# Patient Record
Sex: Female | Born: 1955 | Race: White | Hispanic: No | Marital: Single | Smoking: Never smoker
Health system: Southern US, Community
[De-identification: ages and names within clinical notes are randomized; demographics above are authoritative.]

## PROBLEM LIST (undated history)

## (undated) DIAGNOSIS — F32A Depression, unspecified: Secondary | ICD-10-CM

## (undated) DIAGNOSIS — M797 Fibromyalgia: Secondary | ICD-10-CM

## (undated) DIAGNOSIS — M1990 Unspecified osteoarthritis, unspecified site: Secondary | ICD-10-CM

## (undated) DIAGNOSIS — N83209 Unspecified ovarian cyst, unspecified side: Secondary | ICD-10-CM

## (undated) DIAGNOSIS — J189 Pneumonia, unspecified organism: Secondary | ICD-10-CM

## (undated) DIAGNOSIS — R030 Elevated blood-pressure reading, without diagnosis of hypertension: Secondary | ICD-10-CM

## (undated) DIAGNOSIS — Z8739 Personal history of other diseases of the musculoskeletal system and connective tissue: Secondary | ICD-10-CM

## (undated) DIAGNOSIS — J40 Bronchitis, not specified as acute or chronic: Secondary | ICD-10-CM

## (undated) DIAGNOSIS — C801 Malignant (primary) neoplasm, unspecified: Secondary | ICD-10-CM

## (undated) DIAGNOSIS — F329 Major depressive disorder, single episode, unspecified: Secondary | ICD-10-CM

## (undated) DIAGNOSIS — F419 Anxiety disorder, unspecified: Secondary | ICD-10-CM

## (undated) DIAGNOSIS — Z800 Family history of malignant neoplasm of digestive organs: Secondary | ICD-10-CM

## (undated) DIAGNOSIS — K219 Gastro-esophageal reflux disease without esophagitis: Secondary | ICD-10-CM

## (undated) DIAGNOSIS — T7840XA Allergy, unspecified, initial encounter: Secondary | ICD-10-CM

## (undated) DIAGNOSIS — R009 Unspecified abnormalities of heart beat: Secondary | ICD-10-CM

## (undated) DIAGNOSIS — C44609 Unspecified malignant neoplasm of skin of left upper limb, including shoulder: Secondary | ICD-10-CM

## (undated) DIAGNOSIS — E781 Pure hyperglyceridemia: Secondary | ICD-10-CM

## (undated) HISTORY — DX: Unspecified osteoarthritis, unspecified site: M19.90

## (undated) HISTORY — DX: Unspecified ovarian cyst, unspecified side: N83.209

## (undated) HISTORY — DX: Depression, unspecified: F32.A

## (undated) HISTORY — DX: Unspecified malignant neoplasm of skin of left upper limb, including shoulder: C44.609

## (undated) HISTORY — DX: Bronchitis, not specified as acute or chronic: J40

## (undated) HISTORY — DX: Family history of malignant neoplasm of digestive organs: Z80.0

## (undated) HISTORY — PX: APPENDECTOMY: SHX54

## (undated) HISTORY — PX: ABDOMINAL HYSTERECTOMY: SHX81

## (undated) HISTORY — PX: COLONOSCOPY: SHX174

## (undated) HISTORY — DX: Gastro-esophageal reflux disease without esophagitis: K21.9

## (undated) HISTORY — DX: Major depressive disorder, single episode, unspecified: F32.9

## (undated) HISTORY — DX: Malignant (primary) neoplasm, unspecified: C80.1

## (undated) HISTORY — DX: Elevated blood-pressure reading, without diagnosis of hypertension: R03.0

## (undated) HISTORY — PX: BACK SURGERY: SHX140

## (undated) HISTORY — DX: Pneumonia, unspecified organism: J18.9

## (undated) HISTORY — DX: Fibromyalgia: M79.7

## (undated) HISTORY — DX: Personal history of other diseases of the musculoskeletal system and connective tissue: Z87.39

## (undated) HISTORY — DX: Allergy, unspecified, initial encounter: T78.40XA

## (undated) HISTORY — DX: Unspecified abnormalities of heart beat: R00.9

## (undated) HISTORY — PX: OVARIAN CYST REMOVAL: SHX89

---

## 2009-02-17 ENCOUNTER — Other Ambulatory Visit: Admission: RE | Admit: 2009-02-17 | Discharge: 2009-02-17 | Payer: Self-pay | Admitting: Obstetrics and Gynecology

## 2009-02-24 ENCOUNTER — Encounter: Admission: RE | Admit: 2009-02-24 | Discharge: 2009-02-24 | Payer: Self-pay | Admitting: Obstetrics and Gynecology

## 2010-03-17 ENCOUNTER — Other Ambulatory Visit
Admission: RE | Admit: 2010-03-17 | Discharge: 2010-03-17 | Payer: Self-pay | Source: Home / Self Care | Admitting: Obstetrics and Gynecology

## 2014-06-13 ENCOUNTER — Encounter: Payer: Self-pay | Admitting: Specialist

## 2014-06-13 HISTORY — PX: COLONOSCOPY: SHX174

## 2017-04-21 ENCOUNTER — Emergency Department (HOSPITAL_BASED_OUTPATIENT_CLINIC_OR_DEPARTMENT_OTHER)
Admission: EM | Admit: 2017-04-21 | Discharge: 2017-04-21 | Disposition: A | Discharge status: Home / Self Care | Payer: Worker's Compensation | Attending: Emergency Medicine | Admitting: Emergency Medicine

## 2017-04-21 ENCOUNTER — Encounter (HOSPITAL_BASED_OUTPATIENT_CLINIC_OR_DEPARTMENT_OTHER): Payer: Self-pay | Admitting: *Deleted

## 2017-04-21 ENCOUNTER — Other Ambulatory Visit: Payer: Self-pay

## 2017-04-21 DIAGNOSIS — Y990 Civilian activity done for income or pay: Secondary | ICD-10-CM | POA: Insufficient documentation

## 2017-04-21 DIAGNOSIS — Y9389 Activity, other specified: Secondary | ICD-10-CM | POA: Diagnosis not present

## 2017-04-21 DIAGNOSIS — S39012A Strain of muscle, fascia and tendon of lower back, initial encounter: Secondary | ICD-10-CM | POA: Insufficient documentation

## 2017-04-21 DIAGNOSIS — X500XXA Overexertion from strenuous movement or load, initial encounter: Secondary | ICD-10-CM | POA: Insufficient documentation

## 2017-04-21 DIAGNOSIS — Y92219 Unspecified school as the place of occurrence of the external cause: Secondary | ICD-10-CM | POA: Insufficient documentation

## 2017-04-21 DIAGNOSIS — S3992XA Unspecified injury of lower back, initial encounter: Secondary | ICD-10-CM | POA: Diagnosis present

## 2017-04-21 DIAGNOSIS — Z79899 Other long term (current) drug therapy: Secondary | ICD-10-CM | POA: Diagnosis not present

## 2017-04-21 HISTORY — DX: Depression, unspecified: F32.A

## 2017-04-21 HISTORY — DX: Anxiety disorder, unspecified: F41.9

## 2017-04-21 HISTORY — DX: Pure hyperglyceridemia: E78.1

## 2017-04-21 HISTORY — DX: Major depressive disorder, single episode, unspecified: F32.9

## 2017-04-21 MED ORDER — METHOCARBAMOL 500 MG PO TABS: 500.0000 mg | ORAL_TABLET | Freq: Three times a day (TID) | ORAL | 0 refills | Status: DC | PRN

## 2017-04-21 NOTE — ED Notes (Signed)
ED Provider at bedside. 

## 2017-04-21 NOTE — ED Triage Notes (Signed)
Back and neck injury. Pain in both shoulders. 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 She is ambulatory. No UDS required per pt.

## 2017-04-21 NOTE — ED Provider Notes (Signed)
[facility] EMERGENCY DEPARTMENT Provider Note   CSN: [id number] Arrival date & time: 04/21/17  1417     History   Chief Complaint Chief Complaint  Patient presents with  . Back Injury    HPI Lisa SHOAF is a 62 y.o. female.  HPI Patient with lower back pain.  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  Developed low back pain.  Has been icing.  Has taken Motrin with some relief of pain.  However has continued pain.  Worse with movements.  Has had some previous neck problems but no previous surgery. Past Medical History:  Diagnosis Date  . Anxiety   . Depression   . Hypertriglyceridemia     There are no active problems to display for this patient.   Past Surgical History:  Procedure Laterality Date  . ABDOMINAL HYSTERECTOMY    . APPENDECTOMY    . OVARIAN CYST REMOVAL      OB History    No data available       Home Medications    Prior to Admission medications   Medication Sig Start Date End Date Taking? Authorizing Provider  Citalopram Hydrobromide (CELEXA PO) Take by mouth.   Yes [provider]  Eluxadoline (VIBERZI PO) Take by mouth.   Yes [provider]  FENOFIBRATE PO Take by mouth.   Yes [provider]  GABAPENTIN PO Take by mouth.   Yes [provider]  methocarbamol (ROBAXIN) 500 MG tablet Take 1 tablet (500 mg total) by mouth every 8 (eight) hours as needed for muscle spasms. 04/21/17   Davonna Belling, MD    Family History No family history on file.  Social History Social History   Tobacco Use  . Smoking status: Never Smoker  . Smokeless tobacco: Never Used  Substance Use Topics  . Alcohol use: Yes    Frequency: Never  . Drug use: No     Allergies   Patient has no known allergies.   Review of Systems Review of Systems  Constitutional: Negative for appetite change and fatigue.  HENT: Negative for congestion.    Cardiovascular: Negative for chest pain.  Gastrointestinal: Negative for abdominal pain.  Genitourinary: Negative for difficulty urinating.  Musculoskeletal: Positive for back pain. Negative for neck pain.  Skin: Negative for rash.  Neurological: Negative for weakness and numbness.  Psychiatric/Behavioral: Negative for confusion.     Physical Exam Updated Vital Signs BP 114/71 (BP Location: Left Arm)   Pulse 76   Temp 98.7 F (37.1 C) (Oral)   Resp 16   Ht 5\' 4"  (1.626 m)   Wt 68.9 kg (152 lb)   SpO2 94%   BMI 26.09 kg/m   Physical Exam  Constitutional: She appears well-developed.  HENT:  Head: Atraumatic.  Eyes: Pupils are equal, round, and reactive to light.  Cardiovascular: Normal rate.  Abdominal: Soft. There is no tenderness.  Musculoskeletal:  Mild lumbar paraspinal tenderness.  Good range of motion.  Patient able to bend over and touch her toes without back pain radiating down her legs.  Neurological: She is alert.  Skin: Skin is warm. Capillary refill takes less than 2 seconds.     ED Treatments / Results  Labs (all labs ordered are listed, but only abnormal results are displayed) Labs Reviewed - No data to display  EKG  EKG Interpretation None       Radiology No results  found.  Procedures Procedures (including critical care time)  Medications Ordered in ED Medications - No data to display   Initial Impression / Assessment and Plan / ED Course  I have reviewed the triage vital signs and the nursing notes.  Pertinent labs & imaging results that were available during my care of the patient were reviewed by me and considered in my medical decision making (see chart for details).     Patient with likely muscular skeletal back pain.  Rather benign exam without red flags.  Will discharge home with muscle relaxers.  Follow-up as needed. Final Clinical Impressions(s) / ED Diagnoses   Final diagnoses:  Strain of lumbar region, initial encounter      ED Discharge Orders        Ordered    methocarbamol (ROBAXIN) 500 MG tablet  Every 8 hours PRN     04/21/17 1809       Davonna Belling, MD 04/21/17 1839

## 2018-05-17 DIAGNOSIS — K580 Irritable bowel syndrome with diarrhea: Secondary | ICD-10-CM | POA: Diagnosis not present

## 2018-05-17 DIAGNOSIS — M797 Fibromyalgia: Secondary | ICD-10-CM | POA: Diagnosis not present

## 2018-05-17 DIAGNOSIS — Z6827 Body mass index (BMI) 27.0-27.9, adult: Secondary | ICD-10-CM | POA: Diagnosis not present

## 2018-05-17 DIAGNOSIS — K5732 Diverticulitis of large intestine without perforation or abscess without bleeding: Secondary | ICD-10-CM | POA: Diagnosis not present

## 2018-07-17 DIAGNOSIS — M797 Fibromyalgia: Secondary | ICD-10-CM | POA: Diagnosis not present

## 2018-07-17 DIAGNOSIS — R220 Localized swelling, mass and lump, head: Secondary | ICD-10-CM | POA: Diagnosis not present

## 2018-07-17 DIAGNOSIS — E663 Overweight: Secondary | ICD-10-CM | POA: Diagnosis not present

## 2018-07-17 DIAGNOSIS — K580 Irritable bowel syndrome with diarrhea: Secondary | ICD-10-CM | POA: Diagnosis not present

## 2018-07-17 DIAGNOSIS — Z1331 Encounter for screening for depression: Secondary | ICD-10-CM | POA: Diagnosis not present

## 2018-07-17 DIAGNOSIS — Z9181 History of falling: Secondary | ICD-10-CM | POA: Diagnosis not present

## 2018-07-17 DIAGNOSIS — K5732 Diverticulitis of large intestine without perforation or abscess without bleeding: Secondary | ICD-10-CM | POA: Diagnosis not present

## 2019-03-28 DIAGNOSIS — K219 Gastro-esophageal reflux disease without esophagitis: Secondary | ICD-10-CM | POA: Diagnosis not present

## 2019-03-28 DIAGNOSIS — M797 Fibromyalgia: Secondary | ICD-10-CM | POA: Diagnosis not present

## 2019-05-07 DIAGNOSIS — B9689 Other specified bacterial agents as the cause of diseases classified elsewhere: Secondary | ICD-10-CM | POA: Diagnosis not present

## 2019-05-07 DIAGNOSIS — R6889 Other general symptoms and signs: Secondary | ICD-10-CM | POA: Diagnosis not present

## 2019-05-07 DIAGNOSIS — J029 Acute pharyngitis, unspecified: Secondary | ICD-10-CM | POA: Diagnosis not present

## 2019-05-07 DIAGNOSIS — J208 Acute bronchitis due to other specified organisms: Secondary | ICD-10-CM | POA: Diagnosis not present

## 2019-05-10 DIAGNOSIS — R6889 Other general symptoms and signs: Secondary | ICD-10-CM | POA: Diagnosis not present

## 2019-05-15 DIAGNOSIS — B9689 Other specified bacterial agents as the cause of diseases classified elsewhere: Secondary | ICD-10-CM | POA: Diagnosis not present

## 2019-05-15 DIAGNOSIS — J208 Acute bronchitis due to other specified organisms: Secondary | ICD-10-CM | POA: Diagnosis not present

## 2019-05-15 DIAGNOSIS — H00015 Hordeolum externum left lower eyelid: Secondary | ICD-10-CM | POA: Diagnosis not present

## 2019-05-15 DIAGNOSIS — H04122 Dry eye syndrome of left lacrimal gland: Secondary | ICD-10-CM | POA: Diagnosis not present

## 2019-06-07 DIAGNOSIS — R05 Cough: Secondary | ICD-10-CM | POA: Diagnosis not present

## 2019-06-10 DIAGNOSIS — R05 Cough: Secondary | ICD-10-CM | POA: Diagnosis not present

## 2019-06-10 DIAGNOSIS — R0602 Shortness of breath: Secondary | ICD-10-CM | POA: Diagnosis not present

## 2019-06-27 ENCOUNTER — Ambulatory Visit: Payer: Medicare PPO | Admitting: Internal Medicine

## 2019-06-27 ENCOUNTER — Other Ambulatory Visit: Payer: Self-pay

## 2019-06-27 ENCOUNTER — Encounter: Payer: Self-pay | Admitting: Internal Medicine

## 2019-06-27 VITALS — BP 106/66 | HR 80 | Temp 97.9°F | Ht 64.0 in | Wt 158.0 lb

## 2019-06-27 DIAGNOSIS — J301 Allergic rhinitis due to pollen: Secondary | ICD-10-CM | POA: Diagnosis not present

## 2019-06-27 DIAGNOSIS — R0602 Shortness of breath: Secondary | ICD-10-CM | POA: Diagnosis not present

## 2019-06-27 DIAGNOSIS — R053 Chronic cough: Secondary | ICD-10-CM

## 2019-06-27 DIAGNOSIS — R05 Cough: Secondary | ICD-10-CM

## 2019-06-27 MED ORDER — FLUTICASONE PROPIONATE 50 MCG/ACT NA SUSP: 1.0000 | Freq: Every day | NASAL | 5 refills | Status: DC

## 2019-06-27 MED ORDER — MONTELUKAST SODIUM 10 MG PO TABS: 10.0000 mg | ORAL_TABLET | Freq: Every day | ORAL | 11 refills | Status: DC

## 2019-06-27 NOTE — Progress Notes (Signed)
Lisa Graham    [id number]    Feb 19, 1956  Primary Care Physician:System, Pcp Not In  Referring Physician: Nicoletta Dress, MD [address] [place],  [place] [address] Reason for Consultation: "Cough and congestion SOB" Date of Consultation: 66/15/2021  Chief complaint:   Chief Complaint  Patient presents with  . Consult    SOB, cough and congestion.  Prouductive cough clear to yellow, thick..  Coughs some at night, mostly during the day.  Fatigues easily.     HPI: Lisa Graham is a 64 y.o. woman here with cough and shortness of breath with congestion. Seen by primary care and has had two courses of antibiotics (one with steroids) for bronchitis in the last month.  Here with a third bout of "bronchitis" today which seems to be resolving spontaneously. Cough is day and night with clear mucus, sometimes sticky and green-yellow. No blood.   She has chest congestion. Has been out gardening in the yard. No sneezing.   Eyes are itchy. And she has chronic dry eyes.  No fevers, chills night sweats. Appetite is good. + Frequent throat clearing.   Has had pneumonia/bronchitis almost every year. She does feel short of breath. Never been prescribed any inhalers.  She ran cross country and ran two marathons and never had difficulty with her breathing.    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  Exposures: lives in [place], grew up in [place]. But moved back to [place] from [place] in 2007. Lives at home with poodle.  Smoking history: never smoker, no significant passive smoke exposure.   Social History   Occupational History  . Not on file  Tobacco Use  . Smoking status: Never Smoker  . Smokeless tobacco: Never Used  Substance and Sexual Activity  . Alcohol use: Yes  . Drug use: No  . Sexual activity: Not on file   Relevant family history: Family History  Problem Relation Age of Onset  . Colon  cancer Mother   . Stroke Father    Past Medical History:  Diagnosis Date  . Anxiety   . Bronchitis    2 months ago  . Depression   . Hypertriglyceridemia   . PNA (pneumonia)    2 mos ago    Past Surgical History:  Procedure Laterality Date  . ABDOMINAL HYSTERECTOMY    . APPENDECTOMY    . OVARIAN CYST REMOVAL       Review of systems: Review of Systems  Constitutional: Negative for chills, fever and weight loss.  HENT: Positive for congestion. Negative for sinus pain and sore throat.   Eyes: Negative for discharge and redness.  Respiratory: Positive for cough, sputum production and shortness of breath. Negative for hemoptysis and wheezing.   Cardiovascular: Negative for chest pain, palpitations and leg swelling.  Gastrointestinal: Negative for heartburn, nausea and vomiting.  Musculoskeletal: Positive for joint pain. Negative for myalgias.  Skin: Negative for rash.  Neurological: Negative for dizziness, tremors, focal weakness and headaches.  Endo/Heme/Allergies: Negative for environmental allergies.  Psychiatric/Behavioral: Negative for depression. The patient is not nervous/anxious.   All other systems reviewed and are negative.   Physical Exam: Blood pressure 106/66, pulse 80, temperature 97.9 F (36.6 C), temperature source Temporal, height 5\' 4"  (1.626 m), weight 158 lb (71.7 kg), SpO2 96 %. Gen:      No acute distress ENT:  no nasal polyps, mucus membranes moist,  nasal erythema and debris Lungs:    No increased respiratory effort, symmetric chest wall excursion, clear to auscultation bilaterally, no wheezes or crackles CV:         Regular rate and rhythm; no murmurs, rubs, or gallops.  No pedal edema Abd:      + bowel sounds; soft, non-tender; no distension MSK: no acute synovitis of DIP or PIP joints, no mechanics hands.  Skin:      Warm and dry; no rashes Neuro: normal speech, no focal facial asymmetry Psych: alert and oriented x3, normal mood and  affect   Data Reviewed/Medical Decision Making:  Independent interpretation of tests:  Immunization status:  Immunization History  Administered Date(s) Administered  . Influenza, High Dose Seasonal PF 01/14/2019  . Influenza,inj,Quad PF,6+ Mos 05/24/2019  . Moderna SARS-COVID-2 Vaccination 06/14/2019    . I reviewed prior external note(s) from Dr. Delena Bali . I reviewed the result(s) of the labs and imaging as noted above.  . I have ordered spirometry and exhaled nitric oxide  Assessment:  Chronic cough Symptoms most consistent with upper airway cough syndrome from chronic allergic rhinitis  Plan/Recommendations: We will start scheduled Flonase, continue nighttime Benadryl, will also add montelukast daily.  We will get spirometry and exhaled nitric oxide.  We discussed possibility of adding an as needed inhaler.  We can hold off for now and see how she is feeling after treatment of her rhinitis.   Return to Care: Return in about 3 months (around 09/26/2019).  She will follow up with an APP and can see me again after that.  Lenice Llamas, MD [facility] [place]  CC: Nicoletta Dress, MD

## 2019-06-27 NOTE — Patient Instructions (Addendum)
The patient should have follow up scheduled with APP in 3 months.  Spirometry and FeNO  Take montelukast allergy medicine once a day.   Continue benadryl at night time.   Flonase - 1 spray on each side of your nose twice a day for first week, then 1 spray on each side.   Instructions for use:  If you also use a saline nasal spray or rinse, use that first.  Position the head with the chin slightly tucked. Use the right hand to spray into the left nostril and the right hand to spray into the left nostril.   Point the bottle away from the septum of your nose (cartilage that divides the two sides of your nose).   Hold the nostril closed on the opposite side from where you will spray  Spray once and gently sniff to pull the medicine into the higher parts of your nose.  Don't sniff too hard as the medicine will drain down the back of your throat instead.  Repeat with a second spray on the same side if prescribed.  Repeat on the other side of your nose.

## 2019-08-22 ENCOUNTER — Encounter: Payer: Self-pay | Admitting: Gastroenterology

## 2019-08-29 DIAGNOSIS — K580 Irritable bowel syndrome with diarrhea: Secondary | ICD-10-CM | POA: Diagnosis not present

## 2019-08-29 DIAGNOSIS — M797 Fibromyalgia: Secondary | ICD-10-CM | POA: Diagnosis not present

## 2019-08-29 DIAGNOSIS — Z9181 History of falling: Secondary | ICD-10-CM | POA: Diagnosis not present

## 2019-08-29 DIAGNOSIS — E785 Hyperlipidemia, unspecified: Secondary | ICD-10-CM | POA: Diagnosis not present

## 2019-08-29 DIAGNOSIS — M159 Polyosteoarthritis, unspecified: Secondary | ICD-10-CM | POA: Diagnosis not present

## 2019-08-29 DIAGNOSIS — Z139 Encounter for screening, unspecified: Secondary | ICD-10-CM | POA: Diagnosis not present

## 2019-08-29 DIAGNOSIS — M5090 Cervical disc disorder, unspecified, unspecified cervical region: Secondary | ICD-10-CM | POA: Diagnosis not present

## 2019-08-29 DIAGNOSIS — F331 Major depressive disorder, recurrent, moderate: Secondary | ICD-10-CM | POA: Diagnosis not present

## 2019-08-29 DIAGNOSIS — F419 Anxiety disorder, unspecified: Secondary | ICD-10-CM | POA: Diagnosis not present

## 2019-08-29 DIAGNOSIS — M064 Inflammatory polyarthropathy: Secondary | ICD-10-CM | POA: Diagnosis not present

## 2019-09-24 ENCOUNTER — Other Ambulatory Visit (HOSPITAL_COMMUNITY)
Admission: RE | Admit: 2019-09-24 | Discharge: 2019-09-24 | Disposition: A | Discharge status: Home / Self Care | Payer: Medicare PPO | Source: Ambulatory Visit | Attending: Internal Medicine | Admitting: Internal Medicine

## 2019-09-24 DIAGNOSIS — Z20822 Contact with and (suspected) exposure to covid-19: Secondary | ICD-10-CM | POA: Insufficient documentation

## 2019-09-24 LAB — SARS CORONAVIRUS 2 (TAT 6-24 HRS): SARS Coronavirus 2: NEGATIVE

## 2019-09-26 ENCOUNTER — Encounter: Payer: Self-pay | Admitting: *Deleted

## 2019-09-26 DIAGNOSIS — F32A Depression, unspecified: Secondary | ICD-10-CM | POA: Insufficient documentation

## 2019-09-26 DIAGNOSIS — K5792 Diverticulitis of intestine, part unspecified, without perforation or abscess without bleeding: Secondary | ICD-10-CM | POA: Insufficient documentation

## 2019-09-26 DIAGNOSIS — F419 Anxiety disorder, unspecified: Secondary | ICD-10-CM | POA: Insufficient documentation

## 2019-09-26 DIAGNOSIS — M5090 Cervical disc disorder, unspecified, unspecified cervical region: Secondary | ICD-10-CM | POA: Insufficient documentation

## 2019-09-26 DIAGNOSIS — K56609 Unspecified intestinal obstruction, unspecified as to partial versus complete obstruction: Secondary | ICD-10-CM | POA: Insufficient documentation

## 2019-09-26 DIAGNOSIS — K219 Gastro-esophageal reflux disease without esophagitis: Secondary | ICD-10-CM | POA: Insufficient documentation

## 2019-09-26 DIAGNOSIS — E785 Hyperlipidemia, unspecified: Secondary | ICD-10-CM | POA: Insufficient documentation

## 2019-09-27 ENCOUNTER — Ambulatory Visit: Payer: Medicare PPO

## 2019-09-27 ENCOUNTER — Encounter: Payer: Self-pay | Admitting: Pulmonary Disease

## 2019-09-27 ENCOUNTER — Other Ambulatory Visit: Payer: Self-pay

## 2019-09-27 ENCOUNTER — Ambulatory Visit (INDEPENDENT_AMBULATORY_CARE_PROVIDER_SITE_OTHER): Payer: Medicare PPO | Admitting: Pulmonary Disease

## 2019-09-27 ENCOUNTER — Ambulatory Visit (INDEPENDENT_AMBULATORY_CARE_PROVIDER_SITE_OTHER)
Admission: RE | Admit: 2019-09-27 | Discharge: 2019-09-27 | Disposition: A | Discharge status: Home / Self Care | Payer: Medicare PPO | Source: Ambulatory Visit | Attending: Pulmonary Disease | Admitting: Pulmonary Disease

## 2019-09-27 ENCOUNTER — Ambulatory Visit: Payer: Medicare PPO | Admitting: Primary Care

## 2019-09-27 VITALS — BP 110/78 | HR 70 | Temp 98.0°F | Wt 154.4 lb

## 2019-09-27 DIAGNOSIS — R0602 Shortness of breath: Secondary | ICD-10-CM | POA: Diagnosis not present

## 2019-09-27 DIAGNOSIS — J301 Allergic rhinitis due to pollen: Secondary | ICD-10-CM | POA: Diagnosis not present

## 2019-09-27 DIAGNOSIS — R053 Chronic cough: Secondary | ICD-10-CM

## 2019-09-27 DIAGNOSIS — K219 Gastro-esophageal reflux disease without esophagitis: Secondary | ICD-10-CM | POA: Diagnosis not present

## 2019-09-27 DIAGNOSIS — R059 Cough, unspecified: Secondary | ICD-10-CM | POA: Insufficient documentation

## 2019-09-27 DIAGNOSIS — R05 Cough: Secondary | ICD-10-CM

## 2019-09-27 LAB — CBC WITH DIFFERENTIAL/PLATELET
Basophils Absolute: 0.1 10*3/uL (ref 0.0–0.1)
Basophils Relative: 1.3 % (ref 0.0–3.0)
Eosinophils Absolute: 0.9 10*3/uL — ABNORMAL HIGH (ref 0.0–0.7)
Eosinophils Relative: 10.1 % — ABNORMAL HIGH (ref 0.0–5.0)
HCT: 37 % (ref 36.0–46.0)
Hemoglobin: 12.3 g/dL (ref 12.0–15.0)
Lymphocytes Relative: 37.9 % (ref 12.0–46.0)
Lymphs Abs: 3.5 10*3/uL (ref 0.7–4.0)
MCHC: 33.2 g/dL (ref 30.0–36.0)
MCV: 87.4 fl (ref 78.0–100.0)
Monocytes Absolute: 1 10*3/uL (ref 0.1–1.0)
Monocytes Relative: 10.6 % (ref 3.0–12.0)
Neutro Abs: 3.7 10*3/uL (ref 1.4–7.7)
Neutrophils Relative %: 40.1 % — ABNORMAL LOW (ref 43.0–77.0)
Platelets: 472 10*3/uL — ABNORMAL HIGH (ref 150.0–400.0)
RBC: 4.23 Mil/uL (ref 3.87–5.11)
RDW: 14.6 % (ref 11.5–15.5)
WBC: 9.2 10*3/uL (ref 4.0–10.5)

## 2019-09-27 LAB — NITRIC OXIDE: Nitric Oxide: 17

## 2019-09-27 MED ORDER — BENZONATATE 200 MG PO CAPS: 200.0000 mg | ORAL_CAPSULE | Freq: Three times a day (TID) | ORAL | 1 refills | Status: DC | PRN

## 2019-09-27 MED ORDER — FAMOTIDINE 20 MG PO TABS: 20.0000 mg | ORAL_TABLET | Freq: Every day | ORAL | 3 refills | Status: DC

## 2019-09-27 NOTE — Assessment & Plan Note (Signed)
Plan: Continue Flonase Start nasal saline rinses Start daily antihistamine Can start chlor tabs at night

## 2019-09-27 NOTE — Progress Notes (Signed)
@Patient  ID: Lisa Graham, female    DOB: 06/21/1955, 64 y.o.   MRN: [id number]  Chief Complaint  Patient presents with  . Follow-up    spiro and feno completed.  coughing alot.  lots of mucus production.     Referring provider: No ref. provider found  HPI:  64 year old female never smoker followed in our office for chronic cough  PMH: Anxiety, depression, diverticulitis, GERD, hyperlipidemia Smoker/ Smoking History: Never smoker Maintenance: None Pt of: Shearon Stalls  09/27/2019  - Visit   64 year old female never smoker followed in our office for  Patient was last seen in our office on 06/27/2019 by Dr. Shearon Stalls.  At that time patient was diagnosed with chronic cough felt the symptoms were more consistent with upper airway cough syndrome with chronic allergic rhinitis.  Is planned the patient would start scheduled Flonase, nighttime Benadryl and also add Singulair.  Patient presenting to office today reporting the coughing is still present.  Lisa specifically reports that her coughing is worse at night.  Sometimes worse in the morning.  See cough ROS listed below:   09/27/19 - Cough ROS:   When to the symptoms start: 2 years ago  How are you today: worse now, worsened in last month   Have you had fever/sore throat (first 5 to 7 days of URI) or Have you had cough/nasal congestion (10 to 14 days of URI) : yes nasal congestion  Have you used anything to treat the cough, as anything improved : oragel in back of throat, constant tickle  Is it a dry or wet cough: wet - phelgm green/clear  Does the cough happen when your breathing or when you breathe out: out Other any triggers to your cough, or any aggravating factors: worse at night and in AM   Daily antihistamine: no GERD treatment: Singulair: Yes  Cough checklist (bolded indicates presence):  Adherence, acid reflux, ACE inhibitor, active sinus disease, active smoking, adverse effects of medications (amiodarone/Macrodantin/bb), alpha  1, allergies, aspiration, anxiety, bronchiectasis, congestive heart failure (diastolic)     Questionaires / Pulmonary Flowsheets:   ACT:  No flowsheet data found.  MMRC: No flowsheet data found.  Epworth:  No flowsheet data found.  Tests:   09/27/2019-spirometry-FVC 2.5 (80% predicted), ratio 88, FEV1 2.2 (93% predicted), no clinically significant positive bronchodilator response  FENO:  Lab Results  Component Value Date   NITRICOXIDE 17 09/27/2019    PFT: No flowsheet data found.  WALK:  SIX MIN WALK 09/27/2019  Supplimental Oxygen during Test? (L/min) No  Tech Comments: pt did 2 laps without difficutly.  denied any SOB or wheezing or fatigue.    Imaging: No results found.  Lab Results:  CBC No results found for: WBC, RBC, HGB, HCT, PLT, MCV, MCH, MCHC, RDW, LYMPHSABS, MONOABS, EOSABS, BASOSABS  BMET No results found for: NA, K, CL, CO2, GLUCOSE, BUN, CREATININE, CALCIUM, GFRNONAA, GFRAA  BNP No results found for: BNP  ProBNP No results found for: PROBNP  Specialty Problems      Pulmonary Problems   Chronic cough   Seasonal allergic rhinitis due to pollen      No Known Allergies  Immunization History  Administered Date(s) Administered  . Influenza, High Dose Seasonal PF 01/14/2019  . Influenza,inj,Quad PF,6+ Mos 05/24/2019  . Moderna SARS-COVID-2 Vaccination 06/14/2019    Past Medical History:  Diagnosis Date  . Anxiety   . Bronchitis    2 months ago  . Depression   . Hypertriglyceridemia   . PNA (  pneumonia)    2 mos ago    Tobacco History: Social History   Tobacco Use  Smoking Status Never Smoker  Smokeless Tobacco Never Used   Counseling given: Yes   Continue to not smoke  Outpatient Encounter Medications as of 09/27/2019  Medication Sig  . Ascorbic Acid (VITAMIN C) 100 MG tablet Take 100 mg by mouth daily.  . Citalopram Hydrobromide (CELEXA PO) Take by mouth.  . Eluxadoline (VIBERZI PO) Take by mouth.  . FENOFIBRATE PO  Take by mouth.  . fluticasone (FLONASE) 50 MCG/ACT nasal spray Place 1 spray into both nostrils daily.  Marland Kitchen GABAPENTIN PO Take by mouth.  . lactobacillus acidophilus (BACID) TABS tablet Take 1 tablet by mouth daily.  . methocarbamol (ROBAXIN) 500 MG tablet Take 1 tablet (500 mg total) by mouth every 8 (eight) hours as needed for muscle spasms.  . montelukast (SINGULAIR) 10 MG tablet Take 1 tablet (10 mg total) by mouth at bedtime.  . Omega-3 Fatty Acids (FISH OIL) 1000 MG CAPS Take 1,000 mg by mouth.  . benzonatate (TESSALON) 200 MG capsule Take 1 capsule (200 mg total) by mouth 3 (three) times daily as needed for cough.  . famotidine (PEPCID) 20 MG tablet Take 1 tablet (20 mg total) by mouth at bedtime.   No facility-administered encounter medications on file as of 09/27/2019.     Review of Systems  Review of Systems  Constitutional: Positive for fatigue. Negative for activity change and fever.  HENT: Positive for congestion, postnasal drip and rhinorrhea. Negative for sinus pressure, sinus pain and sore throat.   Respiratory: Positive for cough. Negative for shortness of breath and wheezing.   Cardiovascular: Negative for chest pain and palpitations.  Gastrointestinal: Negative for diarrhea, nausea and vomiting.  Musculoskeletal: Negative for arthralgias.  Neurological: Negative for dizziness.  Psychiatric/Behavioral: Negative for sleep disturbance. The patient is not nervous/anxious.      Physical Exam  BP 110/78   Pulse 70   Temp 98 F (36.7 C) (Oral)   Wt 154 lb 6 oz (70 kg)   SpO2 97%   BMI 26.50 kg/m   Wt Readings from Last 5 Encounters:  09/27/19 154 lb 6 oz (70 kg)  06/27/19 158 lb (71.7 kg)  04/21/17 152 lb (68.9 kg)    BMI Readings from Last 5 Encounters:  09/27/19 26.50 kg/m  06/27/19 27.12 kg/m  04/21/17 26.09 kg/m     Physical Exam Vitals and nursing note reviewed.  Constitutional:      General: Lisa is not in acute distress.    Appearance: Normal  appearance. Lisa is normal weight.  HENT:     Head: Normocephalic and atraumatic.     Right Ear: Tympanic membrane, ear canal and external ear normal. There is no impacted cerumen.     Left Ear: Tympanic membrane, ear canal and external ear normal. There is no impacted cerumen.     Nose: Congestion and rhinorrhea present.     Mouth/Throat:     Mouth: Mucous membranes are moist.     Pharynx: Oropharynx is clear.     Comments: Postnasal drip Eyes:     Pupils: Pupils are equal, round, and reactive to light.  Cardiovascular:     Rate and Rhythm: Normal rate and regular rhythm.     Pulses: Normal pulses.     Heart sounds: Normal heart sounds. No murmur heard.   Pulmonary:     Effort: Pulmonary effort is normal. No respiratory distress.     Breath  sounds: Normal breath sounds. No decreased air movement. No decreased breath sounds, wheezing or rales.  Musculoskeletal:     Cervical back: Normal range of motion.     Right lower leg: No edema.     Left lower leg: No edema.  Skin:    General: Skin is warm and dry.     Capillary Refill: Capillary refill takes less than 2 seconds.  Neurological:     General: No focal deficit present.     Mental Status: Lisa is alert and oriented to person, place, and time. Mental status is at baseline.     Gait: Gait normal.  Psychiatric:        Mood and Affect: Mood normal.        Behavior: Behavior normal.        Thought Content: Thought content normal.        Judgment: Judgment normal.       Assessment & Plan:   Gastroesophageal reflux disease Plan: Continue AcipHex Reviewed GERD lifestyle recommendations Provided information on AVS regarding GERD as well as foods to avoid  Seasonal allergic rhinitis due to pollen Plan: Continue Flonase Start nasal saline rinses Start daily antihistamine Can start chlor tabs at night  Chronic cough Plan: Chest x-ray today Lab work today Continue AcipHex Continue Flonase Start daily  antihistamine Continue Singulair Start nasal saline rinses Offered Tessalon Perles, patient declined May need to consider swallow study or CT chest imaging based off of chest x-ray    Return in about 3 months (around 12/28/2019), or if symptoms worsen or fail to improve, for Follow up with Dr. Shearon Stalls.   Lauraine Rinne, NP 09/27/2019   This appointment required 42 minutes of patient care (this includes precharting, chart review, review of results, face-to-face care, etc.).

## 2019-09-27 NOTE — Patient Instructions (Addendum)
You were seen today by Lauraine Rinne, NP  for:   1. Chronic cough  Chest x-ray today >>>Please present to [place], in basement   Lab work today  We will obtain better control of your allergies as well as your acid reflux >>>based off of your chest x-ray may need to consider a CT of your chest  Cough Home Instructions:  We believe you have a chronic/cyclical cough that is aggravated by reflux , coughing , and drainage.   Goal is to not Cough or clear throat.   Avoid coughing or clearing throat by using:  o non-mint products/sugarless candy o Water o ice chips o Remember NO MINT PRODUCTS   Medications to use:  o Mucinex DM 1-2 every 12 hrs or Delsym 2 tsp every 12 hrs for cough (These are Over the counter) o Tessalon Three times a day  As needed  Cough.  o Chlor tabs 4mg  2 at bedtime  for nasal drip until cough is 100% cough free. (this medication is over the counter)    2. Seasonal allergic rhinitis due to pollen  Please start taking a daily antihistamine:  >>>choose one of: zyrtec, claritin, allegra, or xyzal  >>>these are over the counter medications  >>>can choose generic option  >>>take daily  >>>this medication helps with allergies, post nasal drip, and cough   Continue Flonase  Can start taking chlorpheniramine (aka Chlor tabs) 4 mg tablet (1 to 2 tablets at night) for management of allergies and postnasal drip at night >>> This is an over-the-counter medication >>> This medication is sedating   3. Gastroesophageal reflux disease, unspecified whether esophagitis present  Continue Aciphex 20 mg tablet  >>>Please take 1 tablet daily 15 minutes to 30 minutes before your first meal of the day as well as before your other medications >>>Try to take at the same time each day >>>take this medication daily  Start Famotidine 20mg  in evening   GERD management: >>>Avoid laying flat until 2 hours after meals >>>Elevate head of the bed including entire  chest >>>Reduce size of meals and amount of fat, acid, spices, caffeine and sweets >>>If you are smoking, Please stop! >>>Decrease alcohol consumption >>>Work on maintaining a healthy weight with normal BMI     Follow Up:    Return in about 3 months (around 12/28/2019), or if symptoms worsen or fail to improve, for Follow up with Dr. Shearon Stalls.   Please do your part to reduce the spread of COVID-19:      Reduce your risk of any infection  and COVID19 by using the similar precautions used for avoiding the common cold or flu:   Wash your hands often with soap and warm water for at least 20 seconds.  If soap and water are not readily available, use an alcohol-based hand sanitizer with at least 60% alcohol.   If coughing or sneezing, cover your mouth and nose by coughing or sneezing into the elbow areas of your shirt or coat, into a tissue or into your sleeve (not your hands).  WEAR A MASK when in public   Avoid shaking hands with others and consider head nods or verbal greetings only.  Avoid touching your eyes, nose, or mouth with unwashed hands.   Avoid close contact with people who are sick.  Avoid places or events with large numbers of people in one location, like concerts or sporting events.  If you have some symptoms but not all symptoms, continue to monitor at  home and seek medical attention if your symptoms worsen.  If you are having a medical emergency, call 911.   [place] / e-Visit: [email or web address]         [facility]: [facility]: [phone]                   [facility]: [phone]     It is flu season:   >>> Best ways to protect herself from the flu: Receive the yearly flu vaccine, practice good hand hygiene washing with soap and also using hand sanitizer when available, eat a nutritious meals, get  adequate rest, hydrate appropriately   Please contact the office if your symptoms worsen or you have concerns that you are not improving.   Thank you for choosing [facility] for your healthcare, and for allowing [place] to partner with you on your healthcare journey. I am thankful to be able to provide care to you today.   Wyn Quaker FNP-C     Gastroesophageal Reflux Disease, Adult Gastroesophageal reflux (GER) happens when acid from the stomach flows up into the tube that connects the mouth and the stomach (esophagus). Normally, food travels down the esophagus and stays in the stomach to be digested. With GER, food and stomach acid sometimes move back up into the esophagus. You may have a disease called gastroesophageal reflux disease (GERD) if the reflux:  Happens often.  Causes frequent or very bad symptoms.  Causes problems such as damage to the esophagus. When this happens, the esophagus becomes sore and swollen (inflamed). Over time, GERD can make small holes (ulcers) in the lining of the esophagus. What are the causes? This condition is caused by a problem with the muscle between the esophagus and the stomach. When this muscle is weak or not normal, it does not close properly to keep food and acid from coming back up from the stomach. The muscle can be weak because of:  Tobacco use.  Pregnancy.  Having a certain type of hernia (hiatal hernia).  Alcohol use.  Certain foods and drinks, such as coffee, chocolate, onions, and peppermint. What increases the risk? You are more likely to develop this condition if you:  Are overweight.  Have a disease that affects your connective tissue.  Use NSAID medicines. What are the signs or symptoms? Symptoms of this condition include:  Heartburn.  Difficult or painful swallowing.  The feeling of having a lump in the throat.  A bitter taste in the mouth.  Bad breath.  Having a lot of saliva.  Having an upset or  bloated stomach.  Belching.  Chest pain. Different conditions can cause chest pain. Make sure you see your doctor if you have chest pain.  Shortness of breath or noisy breathing (wheezing).  Ongoing (chronic) cough or a cough at night.  Wearing away of the surface of teeth (tooth enamel).  Weight loss. How is this treated? Treatment will depend on how bad your symptoms are. Your doctor may suggest:  Changes to your diet.  Medicine.  Surgery. Follow these instructions at home: Eating and drinking   Follow a diet as told by your doctor. You may need to avoid foods and drinks such as: ? Coffee and tea (with or without caffeine). ? Drinks that contain alcohol. ? Energy drinks and sports drinks. ? Bubbly (carbonated) drinks or sodas. ? Chocolate and cocoa. ? Peppermint and mint flavorings. ?  Garlic and onions. ? Horseradish. ? Spicy and acidic foods. These include peppers, chili powder, curry powder, vinegar, hot sauces, and BBQ sauce. ? Citrus fruit juices and citrus fruits, such as oranges, lemons, and limes. ? Tomato-based foods. These include red sauce, chili, salsa, and pizza with red sauce. ? Fried and fatty foods. These include donuts, french fries, potato chips, and high-fat dressings. ? High-fat meats. These include hot dogs, rib eye steak, sausage, ham, and bacon. ? High-fat dairy items, such as whole milk, butter, and cream cheese.  Eat small meals often. Avoid eating large meals.  Avoid drinking large amounts of liquid with your meals.  Avoid eating meals during the 2-3 hours before bedtime.  Avoid lying down right after you eat.  Do not exercise right after you eat. Lifestyle   Do not use any products that contain nicotine or tobacco. These include cigarettes, e-cigarettes, and chewing tobacco. If you need help quitting, ask your doctor.  Try to lower your stress. If you need help doing this, ask your doctor.  If you are overweight, lose an amount of  weight that is healthy for you. Ask your doctor about a safe weight loss goal. General instructions  Pay attention to any changes in your symptoms.  Take over-the-counter and prescription medicines only as told by your doctor. Do not take aspirin, ibuprofen, or other NSAIDs unless your doctor says it is okay.  Wear loose clothes. Do not wear anything tight around your waist.  Raise (elevate) the head of your bed about 6 inches (15 cm).  Avoid bending over if this makes your symptoms worse.  Keep all follow-up visits as told by your doctor. This is important. Contact a doctor if:  You have new symptoms.  You lose weight and you do not know why.  You have trouble swallowing or it hurts to swallow.  You have wheezing or a cough that keeps happening.  Your symptoms do not get better with treatment.  You have a hoarse voice. Get help right away if:  You have pain in your arms, neck, jaw, teeth, or back.  You feel sweaty, dizzy, or light-headed.  You have chest pain or shortness of breath.  You throw up (vomit) and your throw-up looks like blood or coffee grounds.  You pass out (faint).  Your poop (stool) is bloody or black.  You cannot swallow, drink, or eat. Summary  If a person has gastroesophageal reflux disease (GERD), food and stomach acid move back up into the esophagus and cause symptoms or problems such as damage to the esophagus.  Treatment will depend on how bad your symptoms are.  Follow a diet as told by your doctor.  Take all medicines only as told by your doctor. This information is not intended to replace advice given to you by your health care provider. Make sure you discuss any questions you have with your health care provider. Document Revised: 09/06/2017 Document Reviewed: 09/06/2017 Elsevier Patient Education  2020 [place] for Gastroesophageal Reflux Disease, Adult When you have gastroesophageal reflux disease (GERD), the  foods you eat and your eating habits are very important. Choosing the right foods can help ease your discomfort. Think about working with a nutrition specialist (dietitian) to help you make good choices. What are tips for following this plan?  Meals  Choose healthy foods that are low in fat, such as fruits, vegetables, whole grains, low-fat dairy products, and lean meat, fish, and poultry.  Eat  small meals often instead of 3 large meals a day. Eat your meals slowly, and in a place where you are relaxed. Avoid bending over or lying down until 2-3 hours after eating.  Avoid eating meals 2-3 hours before bed.  Avoid drinking a lot of liquid with meals.  Cook foods using methods other than frying. Bake, grill, or broil food instead.  Avoid or limit: ? Chocolate. ? Peppermint or spearmint. ? Alcohol. ? Pepper. ? Black and decaffeinated coffee. ? Black and decaffeinated tea. ? Bubbly (carbonated) soft drinks. ? Caffeinated energy drinks and soft drinks.  Limit high-fat foods such as: ? Fatty meat or fried foods. ? Whole milk, cream, butter, or ice cream. ? Nuts and nut butters. ? Pastries, donuts, and sweets made with butter or shortening.  Avoid foods that cause symptoms. These foods may be different for everyone. Common foods that cause symptoms include: ? Tomatoes. ? Oranges, lemons, and limes. ? Peppers. ? Spicy food. ? Onions and garlic. ? Vinegar. Lifestyle  Maintain a healthy weight. Ask your doctor what weight is healthy for you. If you need to lose weight, work with your doctor to do so safely.  Exercise for at least 30 minutes for 5 or more days each week, or as told by your doctor.  Wear loose-fitting clothes.  Do not smoke. If you need help quitting, ask your doctor.  Sleep with the head of your bed higher than your feet. Use a wedge under the mattress or blocks under the bed frame to raise the head of the bed. Summary  When you have gastroesophageal reflux  disease (GERD), food and lifestyle choices are very important in easing your symptoms.  Eat small meals often instead of 3 large meals a day. Eat your meals slowly, and in a place where you are relaxed.  Limit high-fat foods such as fatty meat or fried foods.  Avoid bending over or lying down until 2-3 hours after eating.  Avoid peppermint and spearmint, caffeine, alcohol, and chocolate. This information is not intended to replace advice given to you by your health care provider. Make sure you discuss any questions you have with your health care provider. Document Revised: 06/21/2018 Document Reviewed: 04/05/2016 Elsevier Patient Education  [place].

## 2019-09-27 NOTE — Assessment & Plan Note (Signed)
Plan: Chest x-ray today Lab work today Continue AcipHex Continue Flonase Start daily antihistamine Continue Singulair Start nasal saline rinses Offered Tessalon Perles, patient declined May need to consider swallow study or CT chest imaging based off of chest x-ray

## 2019-09-27 NOTE — Assessment & Plan Note (Signed)
Plan: Continue AcipHex Reviewed GERD lifestyle recommendations Provided information on AVS regarding GERD as well as foods to avoid

## 2019-09-30 LAB — RESPIRATORY ALLERGY PROFILE REGION II ~~LOC~~
Allergen, A. alternata, m6: <0.1 kU/L
Allergen, Cedar tree, t12: <0.1 kU/L
Allergen, Comm Silver Birch, t9: <0.1 kU/L
Allergen, Cottonwood, t14: <0.1 kU/L
Allergen, D pternoyssinus,d7: <0.1 kU/L
Allergen, Mouse Urine Protein, e78: <0.1 kU/L
Allergen, Mulberry, t76: <0.1 kU/L
Allergen, Oak,t7: <0.1 kU/L
Allergen, P. notatum, m1: <0.1 kU/L
Aspergillus fumigatus, m3: <0.1 kU/L
Bermuda Grass: <0.1 kU/L
Box Elder IgE: <0.1 kU/L
CLADOSPORIUM HERBARUM (M2) IGE: <0.1 kU/L
COMMON RAGWEED (SHORT) (W1) IGE: <0.1 kU/L
Cat Dander: <0.1 kU/L
Class: 0
Class: 0
Class: 0
Class: 0
Class: 0
Class: 0
Class: 0
Class: 0
Class: 0
Class: 0
Class: 0
Class: 0
Class: 0
Class: 0
Class: 0
Class: 0
Class: 0
Class: 0
Class: 0
Class: 0
Class: 0
Class: 0
Class: 0
Class: 0
Cockroach: <0.1 kU/L
D. farinae: <0.1 kU/L
Dog Dander: <0.1 kU/L
Elm IgE: <0.1 kU/L
IgE (Immunoglobulin E), Serum: 134 kU/L — ABNORMAL HIGH (ref <=114)
Johnson Grass: <0.1 kU/L
Pecan/Hickory Tree IgE: <0.1 kU/L
Rough Pigweed  IgE: <0.1 kU/L
Sheep Sorrel IgE: <0.1 kU/L
Timothy Grass: <0.1 kU/L

## 2019-09-30 LAB — INTERPRETATION:

## 2019-10-02 NOTE — Progress Notes (Signed)
Called pt and still no answer- LMTCB x 2

## 2019-10-09 ENCOUNTER — Encounter: Payer: Self-pay | Admitting: *Deleted

## 2019-10-09 NOTE — Progress Notes (Signed)
Letter mailed

## 2019-10-11 ENCOUNTER — Encounter: Payer: Self-pay | Admitting: Gastroenterology

## 2019-10-11 ENCOUNTER — Other Ambulatory Visit: Payer: Self-pay

## 2019-10-11 ENCOUNTER — Ambulatory Visit (AMBULATORY_SURGERY_CENTER): Payer: Self-pay | Admitting: *Deleted

## 2019-10-11 VITALS — Ht 64.0 in | Wt 151.0 lb

## 2019-10-11 DIAGNOSIS — Z800 Family history of malignant neoplasm of digestive organs: Secondary | ICD-10-CM

## 2019-10-11 MED ORDER — SUTAB 1479-225-188 MG PO TABS: 24.0000 | ORAL_TABLET | ORAL | 0 refills | Status: DC

## 2019-10-11 NOTE — Progress Notes (Signed)
No egg or soy allergy known to patient  No issues with past sedation with any surgeries or procedures no intubation problems in the past  No diet pills per patient No home 02 use per patient  No blood thinners per patient  Pt states issues with constipation - uses Miralax PRN- mostly soft stools recently  No A fib or A flutter  EMMI video to pt or MyChart  COVID 19 guidelines implemented in [facility] today   sutab Coupon given to pt in [facility] today , code to pharmacy   Due to the COVID-19 pandemic we are asking patients to follow these guidelines. Please only bring one care partner. Please be aware that your care partner may wait in the car in the parking lot or if they feel like they will be too hot to wait in the car, they may wait in the lobby on the 4th floor. All care partners are required to wear a mask the entire time (we do not have any that we can provide them), they need to practice social distancing, and we will do a Covid check for all patient's and care partners when you arrive. Also we will check their temperature and your temperature. If the care partner waits in their car they need to stay in the parking lot the entire time and we will call them on their cell phone when the patient is ready for discharge so they can bring the car to the front of the building. Also all patient's will need to wear a mask into building.

## 2019-10-14 ENCOUNTER — Telehealth: Payer: Self-pay | Admitting: Pulmonary Disease

## 2019-10-14 DIAGNOSIS — R053 Chronic cough: Secondary | ICD-10-CM

## 2019-10-14 DIAGNOSIS — R0602 Shortness of breath: Secondary | ICD-10-CM

## 2019-10-14 DIAGNOSIS — J301 Allergic rhinitis due to pollen: Secondary | ICD-10-CM

## 2019-10-14 NOTE — Telephone Encounter (Signed)
Patient requesting results from spiro, please advise when you return.

## 2019-10-15 NOTE — Telephone Encounter (Signed)
Patient would like to be referred to Allergy Doctor. Patient phone number is [phone].

## 2019-10-15 NOTE — Telephone Encounter (Signed)
Attempted to call pt but unable to reach. Left pt a detailed message letting her know that we have sent the message to [facility] for him to review and that we will let her know info once this has been reviewed by Aaron Edelman.

## 2019-10-15 NOTE — Telephone Encounter (Signed)
[name] please advise on results of Lisa Graham and if you would be ok with Allergy referral per pt request due to the fact BPM and ND are currently out of the office.

## 2019-10-15 NOTE — Telephone Encounter (Signed)
Patient was recently seen by Wyn Quaker nurse practitioner in the office will send message to him as he will be back in the office in the next 2 days as I have not seen patient and unclear what referral would be for. Please let patient know we will be in touch with her as soon as we get an answer from Wyn Quaker

## 2019-10-16 NOTE — Telephone Encounter (Signed)
10/16/2019  This was already stated and explained on previous results, See the message below:  None of the 25 allergens on the lab tests show elevations. But both of patient's allergy markers are elevated. IgE is elevated at 134. Eosinophil counts are elevated at 900.  With spirometry being stable at last office visit I would recommend the patient be evaluated by allergy.  If the patient is agreeable to this please place a referral to an allergist. I prefer Dr. Neldon Mc or Dr. Ernst Bowler if the patient is looking for suggestions.  Wyn Quaker, FNP  There was no reason to route this message to TP NP.  This was already addressed in the result note.  Please place the allergist referral as requested.  Wyn Quaker, FNP

## 2019-10-17 NOTE — Telephone Encounter (Signed)
Order placed for allergy referral as requested.   lmtcb X1 to make pt aware of allergy referral and stable spirometry results.

## 2019-10-21 NOTE — Telephone Encounter (Signed)
Called left message on patient's vm per DPR that referral was placed.   Nothing further needed at this time.

## 2019-10-25 ENCOUNTER — Encounter: Payer: Medicare PPO | Admitting: Gastroenterology

## 2019-10-25 ENCOUNTER — Telehealth: Payer: Self-pay | Admitting: Gastroenterology

## 2019-11-28 ENCOUNTER — Other Ambulatory Visit: Payer: Self-pay

## 2019-11-28 ENCOUNTER — Encounter: Payer: Self-pay | Admitting: Allergy & Immunology

## 2019-11-28 ENCOUNTER — Ambulatory Visit: Payer: Medicare PPO | Admitting: Allergy & Immunology

## 2019-11-28 VITALS — BP 110/72 | HR 80 | Temp 97.7°F | Resp 16 | Ht 64.0 in | Wt 156.0 lb

## 2019-11-28 DIAGNOSIS — J3089 Other allergic rhinitis: Secondary | ICD-10-CM

## 2019-11-28 DIAGNOSIS — J302 Other seasonal allergic rhinitis: Secondary | ICD-10-CM | POA: Diagnosis not present

## 2019-11-28 DIAGNOSIS — R059 Cough, unspecified: Secondary | ICD-10-CM

## 2019-11-28 DIAGNOSIS — R05 Cough: Secondary | ICD-10-CM | POA: Diagnosis not present

## 2019-11-28 MED ORDER — CETIRIZINE HCL 10 MG PO TABS: 10.0000 mg | ORAL_TABLET | Freq: Every day | ORAL | 5 refills | Status: DC

## 2019-11-28 MED ORDER — IPRATROPIUM BROMIDE 0.06 % NA SOLN: 2.0000 | Freq: Three times a day (TID) | NASAL | 5 refills | Status: DC | PRN

## 2019-11-28 MED ORDER — CLARITHROMYCIN 500 MG PO TABS: 500.0000 mg | ORAL_TABLET | Freq: Two times a day (BID) | ORAL | 0 refills | Status: AC

## 2019-11-28 NOTE — Progress Notes (Signed)
NEW PATIENT  Date of Service/Encounter:  11/28/19  Referring provider: Nicoletta Dress, MD   Assessment:   Seasonal and perennial allergic rhinitis (ragweed, weeds and indoor molds)  Cough  Elevated eosinophils   Unfortunately, the environmental allergy testing did not seem to prove with certainty what is causing Lisa Graham's cough.  Her skin testing was not extremely reactive.  We are going to continue with the Flonase and add on Zyrtec as well as nasal ipratropium to see if this will help with any postnasal drip.  I am also going to treat with a course of prednisone and clarithromycin just to rule out sinusitis as a cause of her symptoms, although it should be noted that her cough is going on for much longer than a sinus infection.  She certainly does feel worse today than she has in quite some time.  We will see her back in a few weeks to see if this new plan has helped her cough.  I am still confused about her elevated eosinophils, which I felt initially were related to uncontrolled allergies.  At that there are enough to worry about a hypereosinophilic syndrome, but I am not wholly convinced that it is related to her allergies.  Plan/Recommendations:   1. Chronic rhinitis - Testing today showed: ragweed, weeds and indoor molds (but they were not very large) - Copy of test results provided.  - Avoidance measures provided. - Stop taking: Claritin - Continue taking: Flonase one spray per nostril twice daily - Start taking: Zyrtec (cetirizine) 10mg  tablet once daily and nasal ipratrpopium one spray per nostril three times daily (this can be OVER drying, so be careful). - You can use an extra dose of the antihistamine, if needed, for breakthrough symptoms.  - Consider nasal saline rinses 1-2 times daily to remove allergens from the nasal cavities as well as help with mucous clearance (this is especially helpful to do before the nasal sprays are given) - Consider allergy shots as a  means of long-term control. - Allergy shots "re-train" and "reset" the immune system to ignore environmental allergens and decrease the resulting immune response to those allergens (sneezing, itchy watery eyes, runny nose, nasal congestion, etc).    - Allergy shots improve symptoms in 75-85% of patients.  - We can discuss more at the next appointment if the medications are not working for you.  2. Cough - Start the prednisone dose pack provided today. - Start clarithromycin 500mg  twice daily for ten days.   3. Return in about 6 weeks (around 01/09/2020).   Subjective:   Lisa Graham is a 64 y.o. female presenting today for evaluation of  Chief Complaint  Patient presents with  . Cough  . Allergic Rhinitis     Lisa Graham has a history of the following: Patient Active Problem List   Diagnosis Date Noted  . Seasonal and perennial allergic rhinitis 11/28/2019  . Gastroesophageal reflux disease 09/27/2019  . Cough 09/27/2019  . Seasonal allergic rhinitis due to pollen 09/27/2019  . Anxiety 09/26/2019  . Cervical disc disease 09/26/2019  . Depression 09/26/2019  . Diverticulitis 09/26/2019  . GERD (gastroesophageal reflux disease) 09/26/2019  . Hyperlipidemia 09/26/2019  . Intestinal obstruction ([place]) 09/26/2019    History obtained from: chart review and patient.  Lisa Graham was referred by Nicoletta Dress, MD.     Lisa Graham is a 64 y.o. female presenting for an evaluation of a chronic cough.  She was last seen by pulmonology in  July 2021.  At that time, she was continued on AcipHex as well as GERD lifestyle changes.  For her cough, she had a chest x-ray which was normal as well as the reflux medications and Flonase and Singulair.  She reports that the coughing started two years ago. It has become more terrible over the last three months. Right now, she has done worse and she feels that she has an infection. It sounds very mucousy when she coughs. She has  never smoked at all. She has never had someone smoke in the home. Cough does not worsen during a certain time of the year. This is particularly bad over the last few days. She has had no fever. She does feel worse being off of the antihistamines. She is on a nose spray, which she thinks is helping. She has been on the GERD medication for years, so she is not sure that    She was told that there were two "indicators" that were high, which warranted the referral to Allergy. IgE was 134. Eosinophils were elevated at 900. She does not travel. She has not had any international travel.   Otherwise, there is no history of other atopic diseases, including asthma, food allergies, drug allergies, stinging insect allergies, eczema, urticaria or contact dermatitis. There is no significant infectious history. Vaccinations are up to date.    Past Medical History: Patient Active Problem List   Diagnosis Date Noted  . Seasonal and perennial allergic rhinitis 11/28/2019  . Gastroesophageal reflux disease 09/27/2019  . Cough 09/27/2019  . Seasonal allergic rhinitis due to pollen 09/27/2019  . Anxiety 09/26/2019  . Cervical disc disease 09/26/2019  . Depression 09/26/2019  . Diverticulitis 09/26/2019  . GERD (gastroesophageal reflux disease) 09/26/2019  . Hyperlipidemia 09/26/2019  . Intestinal obstruction ([place]) 09/26/2019    Medication List:  Allergies as of 11/28/2019   No Known Allergies     Medication List       Accurate as of November 28, 2019 10:46 PM. If you have any questions, ask your nurse or doctor.        STOP taking these medications   benzonatate 200 MG capsule Commonly known as: TESSALON Stopped by: Valentina Shaggy, MD     TAKE these medications   buPROPion 150 MG 12 hr tablet Commonly known as: WELLBUTRIN SR   CELEXA PO Take by mouth.   cetirizine 10 MG tablet Commonly known as: ZYRTEC Take 1 tablet (10 mg total) by mouth daily. Started by: Valentina Shaggy,  MD   clarithromycin 500 MG tablet Commonly known as: BIAXIN Take 1 tablet (500 mg total) by mouth 2 (two) times daily for 10 days. Started by: Valentina Shaggy, MD   clonazePAM 1 MG tablet Commonly known as: KLONOPIN   cyclobenzaprine 10 MG tablet Commonly known as: FLEXERIL Take 10 mg by mouth 3 (three) times daily as needed for muscle spasms.   diclofenac 75 MG EC tablet Commonly known as: VOLTAREN Take 75 mg by mouth 2 (two) times daily.   famotidine 20 MG tablet Commonly known as: PEPCID Take 1 tablet (20 mg total) by mouth at bedtime.   fenofibrate 160 MG tablet   FENOFIBRATE PO Take by mouth.   Fish Oil 1000 MG Caps Take 1,000 mg by mouth.   fluticasone 50 MCG/ACT nasal spray Commonly known as: Flonase Place 1 spray into both nostrils daily.   GABAPENTIN PO Take by mouth.   gabapentin 400 MG capsule Commonly known as: NEURONTIN  hyoscyamine 0.125 MG Tbdp disintergrating tablet Commonly known as: ANASPAZ Place 0.125 mg under the tongue.   ipratropium 0.06 % nasal spray Commonly known as: ATROVENT Place 2 sprays into both nostrils 3 (three) times daily as needed for rhinitis. Started by: Valentina Shaggy, MD   lactobacillus acidophilus Tabs tablet Take 1 tablet by mouth daily.   methocarbamol 500 MG tablet Commonly known as: ROBAXIN Take 1 tablet (500 mg total) by mouth every 8 (eight) hours as needed for muscle spasms.   montelukast 10 MG tablet Commonly known as: SINGULAIR Take 1 tablet (10 mg total) by mouth at bedtime.   RABEprazole 20 MG tablet Commonly known as: ACIPHEX   Sutab [phone] MG Tabs Generic drug: Sodium Sulfate-Mag Sulfate-KCl Take 24 tablets by mouth as directed. BIN: [id number] PCN: CN GROUP: [id number] MEMBER ID: [id number];IWP AS CASH;NO PRIOR AUTHORIZATION   venlafaxine XR 150 MG 24 hr capsule Commonly known as: EFFEXOR-XR   VIBERZI PO Take by mouth.   vitamin C 100 MG tablet Take 100 mg by mouth daily.        Birth History: non-contributory  Developmental History: non-contributory  Past Surgical History: Past Surgical History:  Procedure Laterality Date  . ABDOMINAL HYSTERECTOMY    . APPENDECTOMY    . BACK SURGERY    . COLONOSCOPY    . OVARIAN CYST REMOVAL       Family History: Family History  Problem Relation Age of Onset  . Colon cancer Mother 77  . Stroke Father   . Colon cancer Nephew 50  . Colon polyps Neg Hx   . Esophageal cancer Neg Hx   . Rectal cancer Neg Hx   . Stomach cancer Neg Hx   . Allergic rhinitis Neg Hx   . Angioedema Neg Hx   . Asthma Neg Hx   . Atopy Neg Hx   . Eczema Neg Hx   . Immunodeficiency Neg Hx   . Urticaria Neg Hx      Social History: Yariah lives at home with her poodle.  She lives in a house that is 66 years old.  There is vinyl plank flooring.  She has a heat pump for heating and cooling.  There are no dust mite covers on the bedding.  There is no tobacco exposure.  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  She is not exposed to chemicals, fumes, or dust.  She does not have a HEPA filter in her home.  She does not live near an interstate or industrial area.   Review of Systems  Constitutional: Negative.  Negative for chills, fever, malaise/fatigue and weight loss.  HENT: Negative.  Negative for congestion, ear discharge, ear pain, sinus pain and sore throat.   Eyes: Negative for pain, discharge and redness.  Respiratory: Positive for cough. Negative for sputum production, shortness of breath and wheezing.   Cardiovascular: Negative.  Negative for chest pain and palpitations.  Gastrointestinal: Negative for abdominal pain, constipation, diarrhea, heartburn, nausea and vomiting.  Skin: Negative.  Negative for itching and rash.  Neurological: Negative for dizziness and headaches.  Endo/Heme/Allergies: Negative for environmental allergies. Does not bruise/bleed easily.        Objective:   Blood pressure 110/72, pulse 80, temperature 97.7 F (36.5 C), temperature source Temporal, resp. rate 16, height 5\' 4"  (1.626 m), weight 156 lb (70.8 kg), SpO2 98 %. Body mass index is 26.78 kg/m.   Physical Exam:   Physical Exam Constitutional:  Appearance: She is well-developed.     Comments: Hacking cough throughout the visit.  HENT:     Head: Normocephalic and atraumatic.     Right Ear: Tympanic membrane, ear canal and external ear normal. No drainage, swelling or tenderness. Tympanic membrane is not injected, scarred, erythematous, retracted or bulging.     Left Ear: Tympanic membrane, ear canal and external ear normal. No drainage, swelling or tenderness. Tympanic membrane is not injected, scarred, erythematous, retracted or bulging.     Nose: No nasal deformity, septal deviation, mucosal edema or rhinorrhea.     Right Turbinates: Enlarged and swollen.     Left Turbinates: Enlarged and swollen.     Right Sinus: No maxillary sinus tenderness or frontal sinus tenderness.     Left Sinus: No maxillary sinus tenderness or frontal sinus tenderness.     Comments: Erythematous turbinates.  There is some moderate cobblestoning.    Mouth/Throat:     Mouth: Mucous membranes are not pale and not dry.     Pharynx: Uvula midline.  Eyes:     General:        Right eye: No discharge.        Left eye: No discharge.     Conjunctiva/sclera: Conjunctivae normal.     Right eye: Right conjunctiva is not injected. No chemosis.    Left eye: Left conjunctiva is not injected. No chemosis.    Pupils: Pupils are equal, round, and reactive to light.  Cardiovascular:     Rate and Rhythm: Normal rate and regular rhythm.     Heart sounds: Normal heart sounds.  Pulmonary:     Effort: Pulmonary effort is normal. No tachypnea, accessory muscle usage or respiratory distress.     Breath sounds: Normal breath sounds. No wheezing, rhonchi or rales.     Comments: Moving air well in  all lung fields.  No increased work of breathing. Chest:     Chest wall: No tenderness.  Abdominal:     Tenderness: There is no abdominal tenderness. There is no guarding or rebound.  Lymphadenopathy:     Head:     Right side of head: No submandibular, tonsillar or occipital adenopathy.     Left side of head: No submandibular, tonsillar or occipital adenopathy.     Cervical: No cervical adenopathy.  Skin:    General: Skin is warm.     Capillary Refill: Capillary refill takes less than 2 seconds.     Coloration: Skin is not pale.     Findings: No abrasion, erythema, petechiae or rash. Rash is not papular, urticarial or vesicular.     Comments: No eczematous or urticarial lesions noted.  Neurological:     Mental Status: She is alert.  Psychiatric:        Behavior: Behavior is cooperative.      Diagnostic studies:     Allergy Studies:     Airborne Adult Perc - 11/28/19 0900    Time Antigen Placed 0930    Allergen Manufacturer Lavella Hammock    Location Back    Number of Test 59    1. Control-Buffer 50% Glycerol Negative    2. Control-Histamine 1 mg/ml 2+    3. Albumin saline Negative    4. [place] Negative    5. [place] Negative    6. Johnson Negative    7. [place] Blue Negative    8. Meadow Fescue Negative    9. Perennial Rye Negative    10. Sweet Vernal Negative  11. Timothy Negative    12. Cocklebur Negative    13. Burweed Marshelder Negative    14. Ragweed, short Negative    15. Ragweed, Giant Negative    16. Plantain,  English Negative    17. Lamb's Quarters Negative    18. Sheep Sorrell Negative    19. Rough Pigweed Negative    20. Marsh Elder, Rough Negative    21. Mugwort, Common Negative    22. Ash mix Negative    23. Birch mix Negative    24. Beech American Negative    25. Box, Elder Negative    26. Cedar, red Negative    27. Cottonwood, [place] Negative    28. Elm mix Negative    29. Hickory Negative    30. Maple mix Negative    31. Oak, [place] mix  Negative    32. Pecan Pollen Negative    33. Pine mix Negative    34. Sycamore Eastern Negative    35. [place], Black Pollen Negative    36. Alternaria alternata Negative    37. Cladosporium Herbarum Negative    38. Aspergillus mix Negative    39. Penicillium mix Negative    40. Bipolaris sorokiniana (Helminthosporium) Negative    41. Drechslera spicifera (Curvularia) Negative    42. Mucor plumbeus Negative    43. Fusarium moniliforme Negative    44. Aureobasidium pullulans (pullulara) Negative    45. Rhizopus oryzae Negative    46. Botrytis cinera Negative    47. Epicoccum nigrum Negative    48. Phoma betae Negative    49. Candida Albicans Negative    50. Trichophyton mentagrophytes Negative    51. Mite, D Farinae  5,000 AU/ml Negative    52. Mite, D Pteronyssinus  5,000 AU/ml Negative    53. Cat Hair 10,000 BAU/ml Negative    54.  Dog Epithelia Negative    55. Mixed Feathers Negative    56. Horse Epithelia Negative    57. Cockroach, German Negative    58. Mouse Negative    59. Tobacco Leaf Negative          Intradermal - 11/28/19 0900    Time Antigen Placed 1000    Allergen Manufacturer Lavella Hammock    Location Arm    Number of Test 15    Control Negative    [place] Negative    Johnson Negative    7 Grass Negative    Ragweed mix 1+    Weed mix 1+    Tree mix Negative    Mold 1 Negative    Mold 2 1+    Mold 3 Negative    Mold 4 1+    Cat Negative    Dog Negative    Cockroach Negative    Mite mix Negative           Allergy testing results were read and interpreted by myself, documented by clinical staff.         Salvatore Marvel, MD [facility]

## 2019-11-28 NOTE — Patient Instructions (Addendum)
1. Chronic rhinitis - Testing today showed: ragweed, weeds and indoor molds (but they were not very large) - Copy of test results provided.  - Avoidance measures provided. - Stop taking: Claritin - Continue taking: Flonase one spray per nostril twice daily - Start taking: Zyrtec (cetirizine) 10mg  tablet once daily and nasal ipratrpopium one spray per nostril three times daily (this can be OVER drying, so be careful). - You can use an extra dose of the antihistamine, if needed, for breakthrough symptoms.  - Consider nasal saline rinses 1-2 times daily to remove allergens from the nasal cavities as well as help with mucous clearance (this is especially helpful to do before the nasal sprays are given) - Consider allergy shots as a means of long-term control. - Allergy shots "re-train" and "reset" the immune system to ignore environmental allergens and decrease the resulting immune response to those allergens (sneezing, itchy watery eyes, runny nose, nasal congestion, etc).    - Allergy shots improve symptoms in 75-85% of patients.  - We can discuss more at the next appointment if the medications are not working for you.  2. Cough - Start the prednisone dose pack provided today. - Start clarithromycin 500mg  twice daily for ten days.   3. Return in about 6 weeks (around 01/09/2020).    Please inform us of any Emergency Department visits, hospitalizations, or changes in symptoms. Call us before going to the ED for breathing or allergy symptoms since we might be able to fit you in for a sick visit. Feel free to contact us anytime with any questions, problems, or concerns.  It was a pleasure to meet you today!  Websites that have reliable patient information: 1. American Academy of Asthma, Allergy, and Immunology: www.aaaai.org 2. Food Allergy Research and Education (FARE): foodallergy.org 3. Mothers of Asthmatics: http://www.asthmacommunitynetwork.org 4. American College of Allergy, Asthma, and  Immunology: www.acaai.org   COVID-19 Vaccine Information can be found at: ShippingScam.co.uk For questions related to vaccine distribution or appointments, please email [email or web address] or call [phone].     "Like" [place] on Facebook and Instagram for our latest updates!        Make sure you are registered to vote! If you have moved or changed any of your contact information, you will need to get this updated before voting!  In some cases, you MAY be able to register to vote online: [email or web address]    Reducing Pollen Exposure  The American Academy of Allergy, Asthma and Immunology suggests the following steps to reduce your exposure to pollen during allergy seasons.    1. Do not hang sheets or clothing out to dry; pollen may collect on these items. 2. Do not mow lawns or spend time around freshly cut grass; mowing stirs up pollen. 3. Keep windows closed at night.  Keep car windows closed while driving. 4. Minimize morning activities outdoors, a time when pollen counts are usually at their highest. 5. Stay indoors as much as possible when pollen counts or humidity is high and on windy days when pollen tends to remain in the air longer. 6. Use air conditioning when possible.  Many air conditioners have filters that trap the pollen spores. 7. Use a HEPA room air filter to remove pollen form the indoor air you breathe.  Control of Mold Allergen   Mold and fungi can grow on a variety of surfaces provided certain temperature and moisture conditions exist.  Outdoor molds grow on plants, decaying vegetation and soil.  The major outdoor mold, Alternaria and  Cladosporium, are found in very high numbers during hot and dry conditions.  Generally, a late Summer - Fall peak is seen for common outdoor fungal spores.  Rain will temporarily lower outdoor mold spore count, but counts rise rapidly when the  rainy period ends.  The most important indoor molds are Aspergillus and Penicillium.  Dark, humid and poorly ventilated basements are ideal sites for mold growth.  The next most common sites of mold growth are the bathroom and the kitchen.  Indoor (Perennial) Mold Control   Positive indoor molds via skin testing: Aspergillus, Penicillium, Fusarium, Aureobasidium (Pullulara) and Rhizopus  1. Maintain humidity below 50%. 2. Clean washable surfaces with 5% bleach solution. 3. Remove sources e.g. contaminated carpets.

## 2019-12-09 ENCOUNTER — Telehealth: Payer: Self-pay

## 2019-12-09 MED ORDER — PREDNISONE 10 MG PO TABS: ORAL_TABLET | ORAL | 0 refills | Status: DC

## 2019-12-09 NOTE — Telephone Encounter (Signed)
Please call patient.  It looks like she had a cough for awhile now.   Is this cough the same as before? Any fevers or chills?  Besides the prednisone and clarithromycin, did she try any other antibiotics?  Has she had any inhalers in the past?

## 2019-12-09 NOTE — Telephone Encounter (Signed)
I'm sending in a short course of prednisone as that seemed to help. If she has a flare of her symptoms after stopping prednisone let us know.   Take prednisone 40mg  daily x 2 days, 30mg  daily x 2 days, 20mg  daily x 2 days and 10mg  daily x 2 days.  She does not need additional antibiotics right now.

## 2019-12-09 NOTE — Telephone Encounter (Signed)
Spoke with patient, she stated that her cough has gotten worse over the last month to the point she is not able to sleep. She denied any fever or chills and has not been prescribed any other antibiotics. Patient stated she has not had any inhalers only the nasal spray.

## 2019-12-09 NOTE — Telephone Encounter (Signed)
Patient called and stated that she saw Dr. Ernst Bowler on 11/28/19 and he prescribed her Prednisone dose pack and Clarithromycin 500 mg twice a day for 10 days for a cough. Patient stated that she felt better for about 3 days after however once she finished the prednisone she said it all came back and is worse than before. She stated that she is coughing up mucus, sometimes it is yellow or green and other times it is clear. Patient stated that she sounds like she has been smoking for 50 years and is wondering what she can do to help. I informed her that unfortunately Dr. Ernst Bowler was out of the office this week however I would send a message to Dr. Maudie Mercury in regards to this matter. Patient verbalized understanding and would appreciate that. Dr. Maudie Mercury please advise.

## 2019-12-09 NOTE — Addendum Note (Signed)
Addended by: Garnet Sierras on: 12/09/2019 12:37 PM   Modules accepted: Orders

## 2019-12-09 NOTE — Telephone Encounter (Signed)
Spoke with patient and informed her of Dr. Kim's recommendation. Patient verbalized understanding. 

## 2019-12-17 ENCOUNTER — Telehealth: Payer: Self-pay | Admitting: Allergy & Immunology

## 2019-12-17 DIAGNOSIS — D721 Eosinophilia, unspecified: Secondary | ICD-10-CM

## 2019-12-17 DIAGNOSIS — R0981 Nasal congestion: Secondary | ICD-10-CM

## 2019-12-17 NOTE — Telephone Encounter (Signed)
Patient states she has taken antibiotics, two rounds of prednisone (took last one today), and nasal sprays and she still feels bad. Patient still has a bad cough that lasts up to one hour straight at night. Patient states she is gaining weight from all the prednisone and would like to know what she should do. Also, patient said she talked about an ENT referral and would like to know if we can follow up on that.  Please advise.

## 2019-12-17 NOTE — Telephone Encounter (Signed)
Please advise 

## 2019-12-18 NOTE — Telephone Encounter (Signed)
I am going to order some lab work to try to figure out what the cause of her elevated eosinophils are. We never did lab testing at the last visit. One of the tests includes a stool sample, so she will have to pick up a collection kit somewhere, maybe [facility]?  I am also ordering a sinus CT. Can we get that set up?   Once we have that information, we can refer to ENT. If the imaging is normal, I doubt they will want to see her.   Salvatore Marvel, MD [facility] [place]

## 2019-12-18 NOTE — Telephone Encounter (Signed)
Informed pt of the lab orders and that rochelle in the [place] office has the stool sample cup for her to pick up and she can get her labs done there while she picks up sample cup. It looks like the ct was already ordered and they will be in contact with her on when to go do that.

## 2019-12-19 DIAGNOSIS — D721 Eosinophilia, unspecified: Secondary | ICD-10-CM | POA: Diagnosis not present

## 2019-12-20 LAB — ANCA TITERS
Atypical pANCA: <1:20 {titer}
C-ANCA: <1:20 {titer}
P-ANCA: <1:20 {titer}

## 2019-12-20 LAB — STRONGYLOIDES, AB, IGG: Strongyloides, Ab, IgG: NEGATIVE

## 2019-12-23 ENCOUNTER — Telehealth: Payer: Self-pay

## 2019-12-23 DIAGNOSIS — D721 Eosinophilia, unspecified: Secondary | ICD-10-CM | POA: Diagnosis not present

## 2019-12-23 NOTE — Telephone Encounter (Signed)
Pt is aware.  

## 2019-12-23 NOTE — Telephone Encounter (Signed)
-----   Message from Lisa Shaggy, MD sent at 12/23/2019 11:04 AM EDT ----- Can someone call her to let her know that her labs were negative? We sent some labs to screen for Wegener's granulomatosis, which is a disease that can cause chronic sinus infections and breathing problems. In any case, this was negative. We also looked for evidence of a parasite infection and this was negative. I believe I ordered stool studies as well (we did this because her eosinophil count was somewhat high). We still have a few more pieces to put together, including a sinus CT.  Salvatore Marvel, MD [facility] [place]

## 2019-12-26 DIAGNOSIS — Z1331 Encounter for screening for depression: Secondary | ICD-10-CM | POA: Diagnosis not present

## 2019-12-26 DIAGNOSIS — Z9181 History of falling: Secondary | ICD-10-CM | POA: Diagnosis not present

## 2019-12-26 DIAGNOSIS — Z0000 Encounter for general adult medical examination without abnormal findings: Secondary | ICD-10-CM | POA: Diagnosis not present

## 2019-12-26 DIAGNOSIS — E785 Hyperlipidemia, unspecified: Secondary | ICD-10-CM | POA: Diagnosis not present

## 2019-12-27 LAB — OVA AND PARASITE EXAMINATION

## 2019-12-28 ENCOUNTER — Ambulatory Visit: Payer: Medicare PPO | Attending: Internal Medicine

## 2019-12-28 DIAGNOSIS — Z23 Encounter for immunization: Secondary | ICD-10-CM

## 2019-12-28 NOTE — Progress Notes (Signed)
° °  Covid-19 Vaccination Clinic  Name:  CRYSTINA BORRAYO    MRN: [id number] DOB: 01/08/1956  12/28/2019  Ms. Galeas was observed post Covid-19 immunization for 15 minutes without incident. She was provided with Vaccine Information Sheet and instruction to access the V-Safe system.   Ms. Rubel was instructed to call 911 with any severe reactions post vaccine:  Difficulty breathing   Swelling of face and throat   A fast heartbeat   A bad rash all over body   Dizziness and weakness

## 2019-12-30 ENCOUNTER — Telehealth: Payer: Self-pay

## 2019-12-30 NOTE — Telephone Encounter (Signed)
Pt is aware.  

## 2019-12-30 NOTE — Telephone Encounter (Signed)
Patient called to check and see why she hadn't been scheduled for her Sinus CT Scan at [facility] as she has been waiting for 2 weeks.   I called their office to see if we had put it in wrong & the scheduler asked if we had done a Pre Cert as the patients insurance will require one.   Once this has been done & approved, we will need to call their office to let them know so the patient can be scheduled.   I have informed the patient of this information. Once this process has been started please give the patient a call to update.    [facility] at [facility] [place],  [place]  [address] Get Driving Directions Phone: [phone]

## 2019-12-30 NOTE — Telephone Encounter (Signed)
-----   Message from Valentina Shaggy, MD sent at 12/30/2019  6:05 AM EDT ----- Can someone call the patient to let her know that her stool sample was negative for parasites?  Salvatore Marvel, MD Allergy and [place] [facility] [place]

## 2019-12-31 NOTE — Telephone Encounter (Signed)
Please advise. Thank you

## 2019-12-31 NOTE — Telephone Encounter (Signed)
Can someone work on this pre-certification? 64 y.o. female with chronic sinusitis s/p two rounds of antibiotics with much improvement.   Salvatore Marvel, MD Allergy and [place] [facility] [place]

## 2019-12-31 NOTE — Telephone Encounter (Signed)
Left message for pt. To return my call. I need to know the other antibiotics she was taking prior to her office visit with Dr. Ernst Bowler.

## 2020-01-01 NOTE — Telephone Encounter (Signed)
PT called back, states she wasnt on antibiotics recently before Dr Bing Quarry rx.

## 2020-01-01 NOTE — Telephone Encounter (Signed)
No antibitoics recently

## 2020-01-02 MED ORDER — AMOXICILLIN-POT CLAVULANATE 875-125 MG PO TABS: 1.0000 | ORAL_TABLET | Freq: Two times a day (BID) | ORAL | 0 refills | Status: DC

## 2020-01-02 NOTE — Telephone Encounter (Signed)
Yes we can do another round of antibiotics then.  Lets do Augmentin twice daily for 2 weeks.  Prescription pended.  Can someone call the patient to confirm the pharmacy and then send on my behalf?  Salvatore Marvel, MD Allergy and [place] [facility] [place]

## 2020-01-02 NOTE — Addendum Note (Signed)
Addended by: Valentina Shaggy on: 01/02/2020 08:40 AM   Modules accepted: Orders

## 2020-01-02 NOTE — Addendum Note (Signed)
Addended by: Felipa Emory on: 01/02/2020 09:02 AM   Modules accepted: Orders

## 2020-01-02 NOTE — Telephone Encounter (Signed)
All [facility]s [facility] in [place] went ahead and sent in augmention lm for pt to call us back about this

## 2020-01-09 ENCOUNTER — Ambulatory Visit: Payer: Medicare PPO | Admitting: Allergy & Immunology

## 2020-01-09 ENCOUNTER — Other Ambulatory Visit: Payer: Self-pay

## 2020-01-09 ENCOUNTER — Encounter: Payer: Self-pay | Admitting: Allergy & Immunology

## 2020-01-09 VITALS — BP 90/70 | HR 93 | Temp 97.8°F | Resp 19

## 2020-01-09 DIAGNOSIS — D721 Eosinophilia, unspecified: Secondary | ICD-10-CM

## 2020-01-09 DIAGNOSIS — J302 Other seasonal allergic rhinitis: Secondary | ICD-10-CM | POA: Diagnosis not present

## 2020-01-09 DIAGNOSIS — R221 Localized swelling, mass and lump, neck: Secondary | ICD-10-CM | POA: Diagnosis not present

## 2020-01-09 DIAGNOSIS — R0981 Nasal congestion: Secondary | ICD-10-CM

## 2020-01-09 DIAGNOSIS — J3089 Other allergic rhinitis: Secondary | ICD-10-CM

## 2020-01-09 NOTE — Progress Notes (Signed)
FOLLOW UP  Date of Service/Encounter:  01/09/20   Assessment:    Seasonal and perennial allergic rhinitis (ragweed, weeds and indoor molds)  Cough - improved with a couple of rounds of antibiotics    Elevated eosinophils - rechecking CBC with diff today  Left neck mass - getting thyroid studies today  Plan/Recommendations:   1. Chronic rhinitis (ragweed, weeds and indoor molds) - Continue taking: Flonase one spray per nostril twice daily and Zyrtec (cetirizine) 10mg  tablet once daily and nasal ipratrpopium one spray per nostril three times daily (this can be OVER drying, so be careful). - Consider allergy shots as a means of long-term control. - Allergy shots "re-train" and "reset" the immune system to ignore environmental allergens and decrease the resulting immune response to those allergens (sneezing, itchy watery eyes, runny nose, nasal congestion, etc).    - Allergy shots improve symptoms in 75-85% of patients.  - Consider ENT evaluation.   2. Cough - improved - Complete the antibiotic you are on.   3. Return in about 6 months (around 07/09/2020).   Subjective:   Lisa Graham is a 64 y.o. female presenting today for follow up of  Chief Complaint  Patient presents with  . Allergic Rhinitis     pt feels like her sx have improved over the last six weeks     Lisa Graham has a history of the following: Patient Active Problem List   Diagnosis Date Noted  . Seasonal and perennial allergic rhinitis 11/28/2019  . Gastroesophageal reflux disease 09/27/2019  . Cough 09/27/2019  . Seasonal allergic rhinitis due to pollen 09/27/2019  . Anxiety 09/26/2019  . Cervical disc disease 09/26/2019  . Depression 09/26/2019  . Diverticulitis 09/26/2019  . GERD (gastroesophageal reflux disease) 09/26/2019  . Hyperlipidemia 09/26/2019  . Intestinal obstruction ([place]) 09/26/2019    History obtained from: chart review and patient.  Lisa Graham is a 64 y.o. female  presenting for a follow up visit.  She was last seen in September 2021.  At that time, her testing was positive to ragweed, weeds, and indoor molds.  However, there is no not very large.  We stopped the Claritin and started Zyrtec.  We also started ipratropium 1 spray per nostril 3 times daily.  We continued the Flonase.  For her cough, we started prednisone as well as clarithromycin.  We did treat her with a longer course of antibiotics.  She started her most recent antibiotic. She is on the Augmentin right now and she is feeling better with less popping. She is sleeping more and better. She will sometime get the build up of fluid that sounds as "if [she] has been smoking for a while". She has been using the nose sprays with improvement in her symptoms. She thinks that we are headed the right way.   She had the negative parasite testing including stool studies. She feels that we have done a good job trying to figure where she was getting elevated eosinophils.   She takes medicine for reflux. This has not seemed to have helped her symptoms. She did have an endoscopy in the early 2000s. This was completely normal per the patient.   She does report today that she is concerned about the neck mass on the left side of her neck.  On exam, it is rather difficult to feel.  She denies ever being worked up for a thyroid issue, although she is unsure of the labs sent each time she visits her PCP.  She is also worried about her platelet levels.  She tells me that she had elevated platelets at some point in time and they were concerned that she had "lymphoma".  I did look at her platelets from last CBC they were slightly elevated in the 470s.  Otherwise, there have been no changes to her past medical history, surgical history, family history, or social history.    Review of Systems  Constitutional: Negative.  Negative for chills, fever, malaise/fatigue and weight loss.  HENT: Negative.  Negative for congestion,  ear discharge, ear pain and hearing loss.   Eyes: Negative for pain, discharge and redness.  Respiratory: Negative for cough, sputum production, shortness of breath and wheezing.   Cardiovascular: Negative.  Negative for chest pain and palpitations.  Gastrointestinal: Negative for abdominal pain, constipation, diarrhea, heartburn, nausea and vomiting.  Skin: Negative.  Negative for itching and rash.  Neurological: Negative for dizziness and headaches.  Endo/Heme/Allergies: Negative for environmental allergies. Does not bruise/bleed easily.       Objective:   Blood pressure 90/70, pulse 93, temperature 97.8 F (36.6 C), temperature source Temporal, resp. rate 19, SpO2 95 %. There is no height or weight on file to calculate BMI.   Physical Exam:  Physical Exam Constitutional:      Appearance: She is well-developed.     Comments: Talkative female.  HENT:     Head: Normocephalic and atraumatic.     Right Ear: Tympanic membrane, ear canal and external ear normal.     Left Ear: Tympanic membrane, ear canal and external ear normal.     Nose: No nasal deformity, septal deviation, mucosal edema or rhinorrhea.     Right Turbinates: Enlarged and swollen.     Left Turbinates: Enlarged and swollen.     Right Sinus: No maxillary sinus tenderness or frontal sinus tenderness.     Left Sinus: No maxillary sinus tenderness or frontal sinus tenderness.     Comments: No cobblestoning in the posterior oropharynx.     Mouth/Throat:     Mouth: Mucous membranes are not pale and not dry.     Pharynx: Uvula midline.     Comments: There might be a slightly enlarged lymph node in the anterior cervical chain on the left.  However, it is nontender. Eyes:     General: Lids are normal.        Right eye: No discharge.        Left eye: No discharge.     Conjunctiva/sclera: Conjunctivae normal.     Right eye: Right conjunctiva is not injected. No chemosis.    Left eye: Left conjunctiva is not injected. No  chemosis.    Pupils: Pupils are equal, round, and reactive to light.  Cardiovascular:     Rate and Rhythm: Normal rate and regular rhythm.     Heart sounds: Normal heart sounds.  Pulmonary:     Effort: Pulmonary effort is normal. No tachypnea, accessory muscle usage or respiratory distress.     Breath sounds: Normal breath sounds. No wheezing, rhonchi or rales.     Comments: Moving air well in all lung fields.  No increased work of breathing. Chest:     Chest wall: No tenderness.  Lymphadenopathy:     Cervical: No cervical adenopathy.  Skin:    Coloration: Skin is not pale.     Findings: No abrasion, erythema, petechiae or rash. Rash is not papular, urticarial or vesicular.  Neurological:     Mental Status: She is alert.  Psychiatric:        Behavior: Behavior is cooperative.      Diagnostic studies: labs sent instead      Salvatore Marvel, MD  Allergy and [place] [facility] [place]

## 2020-01-09 NOTE — Patient Instructions (Addendum)
1. Chronic rhinitis (ragweed, weeds and indoor molds) - Continue taking: Flonase one spray per nostril twice daily and Zyrtec (cetirizine) 10mg  tablet once daily and nasal ipratrpopium one spray per nostril three times daily (this can be OVER drying, so be careful). - Consider allergy shots as a means of long-term control. - Allergy shots "re-train" and "reset" the immune system to ignore environmental allergens and decrease the resulting immune response to those allergens (sneezing, itchy watery eyes, runny nose, nasal congestion, etc).    - Allergy shots improve symptoms in 75-85% of patients.  - Consider ENT evaluation.   2. Cough - improved - Complete the antibiotic you are on.   3. Return in about 6 months (around 07/09/2020).    Please inform us of any Emergency Department visits, hospitalizations, or changes in symptoms. Call us before going to the ED for breathing or allergy symptoms since we might be able to fit you in for a sick visit. Feel free to contact us anytime with any questions, problems, or concerns.  It was a pleasure to see you again today!  Websites that have reliable patient information: 1. American Academy of Asthma, Allergy, and Immunology: www.aaaai.org 2. Food Allergy Research and Education (FARE): foodallergy.org 3. Mothers of Asthmatics: http://www.asthmacommunitynetwork.org 4. American College of Allergy, Asthma, and Immunology: www.acaai.org   COVID-19 Vaccine Information can be found at: ShippingScam.co.uk For questions related to vaccine distribution or appointments, please email [email or web address] or call [phone].     Like [place] on [place] and Instagram for our latest updates!        Make sure you are registered to vote! If you have moved or changed any of your contact information, you will need to get this updated before voting!  In some cases, you MAY be able to register to vote  online: [email or web address].[email or web address]    Reducing Pollen Exposure  The American Academy of Allergy, Asthma and Immunology suggests the following steps to reduce your exposure to pollen during allergy seasons.    1. Do not hang sheets or clothing out to dry; pollen may collect on these items. 2. Do not mow lawns or spend time around freshly cut grass; mowing stirs up pollen. 3. Keep windows closed at night.  Keep car windows closed while driving. 4. Minimize morning activities outdoors, a time when pollen counts are usually at their highest. 5. Stay indoors as much as possible when pollen counts or humidity is high and on windy days when pollen tends to remain in the air longer. 6. Use air conditioning when possible.  Many air conditioners have filters that trap the pollen spores. 7. Use a HEPA room air filter to remove pollen form the indoor air you breathe.  Control of Mold Allergen   Mold and fungi can grow on a variety of surfaces provided certain temperature and moisture conditions exist.  Outdoor molds grow on plants, decaying vegetation and soil.  The major outdoor mold, Alternaria and Cladosporium, are found in very high numbers during hot and dry conditions.  Generally, a late Summer - Fall peak is seen for common outdoor fungal spores.  Rain will temporarily lower outdoor mold spore count, but counts rise rapidly when the rainy period ends.  The most important indoor molds are Aspergillus and Penicillium.  Dark, humid and poorly ventilated basements are ideal sites for mold growth.  The next most common sites of mold growth are the bathroom and the kitchen.  Indoor (Perennial) Mold Control   Positive indoor molds via skin testing:  Aspergillus, Penicillium, Fusarium, Aureobasidium ([place]) and Rhizopus  1. Maintain humidity below 50%. 2. Clean washable surfaces with 5% bleach solution. 3. Remove sources e.g. contaminated carpets.

## 2020-01-10 LAB — THYROID PANEL WITH TSH
Free Thyroxine Index: 1.3 (ref 1.2–4.9)
T3 Uptake Ratio: 21 % — ABNORMAL LOW (ref 24–39)
T4, Total: 6.2 ug/dL (ref 4.5–12.0)
TSH: 2.99 u[IU]/mL (ref 0.450–4.500)

## 2020-01-10 LAB — CBC WITH DIFFERENTIAL/PLATELET
Basophils Absolute: 0 10*3/uL (ref 0.0–0.2)
Basos: 1 %
EOS (ABSOLUTE): 0.5 10*3/uL — ABNORMAL HIGH (ref 0.0–0.4)
Eos: 7 %
Hematocrit: 41.7 % (ref 34.0–46.6)
Hemoglobin: 13.4 g/dL (ref 11.1–15.9)
Immature Grans (Abs): 0 10*3/uL (ref 0.0–0.1)
Immature Granulocytes: 0 %
Lymphocytes Absolute: 2.7 10*3/uL (ref 0.7–3.1)
Lymphs: 37 %
MCH: 28.7 pg (ref 26.6–33.0)
MCHC: 32.1 g/dL (ref 31.5–35.7)
MCV: 89 fL (ref 79–97)
Monocytes Absolute: 0.7 10*3/uL (ref 0.1–0.9)
Monocytes: 10 %
Neutrophils Absolute: 3.5 10*3/uL (ref 1.4–7.0)
Neutrophils: 45 %
Platelets: 604 10*3/uL — ABNORMAL HIGH (ref 150–450)
RBC: 4.67 x10E6/uL (ref 3.77–5.28)
RDW: 13.6 % (ref 11.7–15.4)
WBC: 7.5 10*3/uL (ref 3.4–10.8)

## 2020-01-15 ENCOUNTER — Telehealth: Payer: Self-pay | Admitting: Allergy & Immunology

## 2020-01-15 DIAGNOSIS — D6859 Other primary thrombophilia: Secondary | ICD-10-CM

## 2020-01-15 NOTE — Telephone Encounter (Signed)
Patient called and wanted to know why she has not heard from [place] and referring her to a heme/onc? Yet. She said that dr gallanger said platelets elevated are very low. [phone].

## 2020-01-15 NOTE — Telephone Encounter (Signed)
Dr. Gallagher please advise.  

## 2020-01-15 NOTE — Telephone Encounter (Signed)
Called patient and informed that we are working on this. Patient verbalized understanding.

## 2020-01-15 NOTE — Telephone Encounter (Signed)
It looks like I routed the result note to the pool October 31 and asked them to you asked the patient if she wants referral to heme-onc.  But states she does not want a referral for heme-onc for elevated platelets, I will route this disease that she can get that started.  Salvatore Marvel, MD Allergy and [place] [facility] [place]

## 2020-01-16 ENCOUNTER — Telehealth: Payer: Self-pay

## 2020-01-16 ENCOUNTER — Telehealth: Payer: Self-pay | Admitting: Family

## 2020-01-16 NOTE — Telephone Encounter (Signed)
Called and LMVM & mailed New Patient packet to patient as well

## 2020-01-16 NOTE — Telephone Encounter (Signed)
Spoke to patient and informed her of lab results and she stated she would like a referral to hematology/oncology. Sending referral request to admin pool.

## 2020-01-21 NOTE — Telephone Encounter (Signed)
I think I put this referral in already.  Salvatore Marvel, MD Allergy and [place] [facility] [place]

## 2020-01-21 NOTE — Telephone Encounter (Signed)
Patient is already scheduled for 11/24 with [facility]/

## 2020-02-04 ENCOUNTER — Other Ambulatory Visit: Payer: Self-pay | Admitting: Family

## 2020-02-04 DIAGNOSIS — D473 Essential (hemorrhagic) thrombocythemia: Secondary | ICD-10-CM

## 2020-02-04 DIAGNOSIS — D509 Iron deficiency anemia, unspecified: Secondary | ICD-10-CM

## 2020-02-05 ENCOUNTER — Other Ambulatory Visit: Payer: Self-pay

## 2020-02-05 ENCOUNTER — Encounter: Payer: Self-pay | Admitting: Family

## 2020-02-05 ENCOUNTER — Inpatient Hospital Stay (HOSPITAL_BASED_OUTPATIENT_CLINIC_OR_DEPARTMENT_OTHER): Payer: Medicare PPO | Admitting: Family

## 2020-02-05 ENCOUNTER — Inpatient Hospital Stay: Payer: Medicare PPO | Attending: Hematology & Oncology

## 2020-02-05 VITALS — BP 112/68 | HR 88 | Temp 97.5°F | Resp 19 | Ht 64.0 in | Wt 152.4 lb

## 2020-02-05 DIAGNOSIS — R002 Palpitations: Secondary | ICD-10-CM | POA: Diagnosis not present

## 2020-02-05 DIAGNOSIS — R5383 Other fatigue: Secondary | ICD-10-CM | POA: Insufficient documentation

## 2020-02-05 DIAGNOSIS — Z800 Family history of malignant neoplasm of digestive organs: Secondary | ICD-10-CM | POA: Insufficient documentation

## 2020-02-05 DIAGNOSIS — R0602 Shortness of breath: Secondary | ICD-10-CM | POA: Diagnosis not present

## 2020-02-05 DIAGNOSIS — D75839 Thrombocytosis, unspecified: Secondary | ICD-10-CM

## 2020-02-05 DIAGNOSIS — D473 Essential (hemorrhagic) thrombocythemia: Secondary | ICD-10-CM

## 2020-02-05 DIAGNOSIS — D509 Iron deficiency anemia, unspecified: Secondary | ICD-10-CM | POA: Insufficient documentation

## 2020-02-05 LAB — CBC WITH DIFFERENTIAL (CANCER CENTER ONLY)
Abs Immature Granulocytes: 0.03 10*3/uL (ref 0.00–0.07)
Basophils Absolute: 0.1 10*3/uL (ref 0.0–0.1)
Basophils Relative: 1 %
Eosinophils Absolute: 0.6 10*3/uL — ABNORMAL HIGH (ref 0.0–0.5)
Eosinophils Relative: 7 %
HCT: 41.6 % (ref 36.0–46.0)
Hemoglobin: 13.3 g/dL (ref 12.0–15.0)
Immature Granulocytes: 0 %
Lymphocytes Relative: 32 %
Lymphs Abs: 2.7 10*3/uL (ref 0.7–4.0)
MCH: 29.1 pg (ref 26.0–34.0)
MCHC: 32 g/dL (ref 30.0–36.0)
MCV: 91 fL (ref 80.0–100.0)
Monocytes Absolute: 0.8 10*3/uL (ref 0.1–1.0)
Monocytes Relative: 10 %
Neutro Abs: 4.3 10*3/uL (ref 1.7–7.7)
Neutrophils Relative %: 50 %
Platelet Count: 558 10*3/uL — ABNORMAL HIGH (ref 150–400)
RBC: 4.57 MIL/uL (ref 3.87–5.11)
RDW: 14 % (ref 11.5–15.5)
WBC Count: 8.5 10*3/uL (ref 4.0–10.5)
nRBC: 0 % (ref 0.0–0.2)

## 2020-02-05 LAB — CMP (CANCER CENTER ONLY)
ALT: 21 U/L (ref 0–44)
AST: 24 U/L (ref 15–41)
Albumin: 4.6 g/dL (ref 3.5–5.0)
Alkaline Phosphatase: 49 U/L (ref 38–126)
Anion gap: 9 (ref 5–15)
BUN: 29 mg/dL — ABNORMAL HIGH (ref 8–23)
CO2: 26 mmol/L (ref 22–32)
Calcium: 9.8 mg/dL (ref 8.9–10.3)
Chloride: 103 mmol/L (ref 98–111)
Creatinine: 1.19 mg/dL — ABNORMAL HIGH (ref 0.44–1.00)
GFR, Estimated: 50 mL/min — ABNORMAL LOW (ref >60)
Glucose, Bld: 91 mg/dL (ref 70–99)
Potassium: 4.4 mmol/L (ref 3.5–5.1)
Sodium: 138 mmol/L (ref 135–145)
Total Bilirubin: 0.3 mg/dL (ref 0.3–1.2)
Total Protein: 8 g/dL (ref 6.5–8.1)

## 2020-02-05 LAB — IRON AND TIBC
Iron: 58 ug/dL (ref 28–170)
Saturation Ratios: 11 % (ref 10.4–31.8)
TIBC: 518 ug/dL — ABNORMAL HIGH (ref 250–450)
UIBC: 460 ug/dL

## 2020-02-05 LAB — FERRITIN: Ferritin: 58 ng/mL (ref 11–307)

## 2020-02-05 LAB — LACTATE DEHYDROGENASE: LDH: 132 U/L (ref 98–192)

## 2020-02-05 LAB — SAVE SMEAR(SSMR), FOR PROVIDER SLIDE REVIEW

## 2020-02-05 NOTE — Progress Notes (Signed)
Hematology/Oncology Consultation   Name: Lisa Graham      MRN: [id number]    Location: Room/bed info not found  Date: 02/05/2020 Time:3:23 PM   REFERRING PHYSICIAN: Salvatore Marvel, MD  REASON FOR CONSULT: Thrombophilia    DIAGNOSIS: Thrombocytosis   HISTORY OF PRESENT ILLNESS: Ms. Cubero is a very pleasant 64 yo caucasian female with a long history of elevated platelets. She states that her counts have been high for years and that she had precious work up out of states that was inconclusive.  Platelets today are 558, WBC count 8.5, Hgb 13.3 and MCV 91.  No history of bleeding. No abnormal bruising, no petechiae.  No history of thrombus or miscarriage.  She has had a total hysterectomy.  No hot flashes or night sweats.  She has some fatigue at times. She has occasional episodes of mild SOB and palpitations with over exertion and will take a break to rest when needed.  She states that she has history of iron deficiency anemia while in college.  She has no issue with frequent or recurrent infection.  No fever, chills, n/v, cough, rash, dizziness, chest pain, abdominal pain or changes in bowel or bladder habits.  She takes Miralax as needed for constipation.  She sees an allergist for elevated eosinophils and cough/congestion associated with allergies.  No diabetes of history of thyroid disease.  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  Family history includes: maternal grandmother - leukemia, mother and nephew - colon and sister - ovarian.  Her father had history of stroke.  Patient denies history of heart disease or stroke.  She is due for colonoscopy and plans to reschedule.  She states that he is up to date on her mammogram and last result was negative.  No swelling, tenderness, numbness or tingling in her extremities.  She has occasional positional numbness and tingling in her hands that resolves once she moves.  No falls or syncopal  episodes to report.  She has maintained a good appetite and is staying well hydrated. Her weight is described as stable.  She has never been a smoker. She has the occasional alcoholic beverage socially.  She is a retired Emergency planning/management officer.   She has a toy poodle.   ROS: All other 10 point review of systems is negative.   PAST MEDICAL HISTORY:   Past Medical History:  Diagnosis Date  . Allergy   . Anxiety   . Arthritis   . Bronchitis    2 months ago  . Cancer (HCC)    squamous cell skin cancer   . Depression   . Elevated heart rate with elevated blood pressure without diagnosis of hypertension   . GERD (gastroesophageal reflux disease)   . Hypertriglyceridemia   . PNA (pneumonia)    2 mos ago    ALLERGIES: No Known Allergies    MEDICATIONS:  Current Outpatient Medications on File Prior to Visit  Medication Sig Dispense Refill  . amoxicillin-clavulanate (AUGMENTIN) 875-125 MG tablet Take 1 tablet by mouth 2 (two) times daily. 14 tablet 0  . Ascorbic Acid (VITAMIN C) 100 MG tablet Take 100 mg by mouth daily.    Marland Kitchen buPROPion (WELLBUTRIN SR) 150 MG 12 hr tablet     . cetirizine (ZYRTEC) 10 MG tablet Take 1 tablet (10 mg total) by mouth daily. 30 tablet 5  . Citalopram Hydrobromide (CELEXA PO) Take by mouth.    . clonazePAM (KLONOPIN)  1 MG tablet     . cyclobenzaprine (FLEXERIL) 10 MG tablet Take 10 mg by mouth 3 (three) times daily as needed for muscle spasms.    . diclofenac (VOLTAREN) 75 MG EC tablet Take 75 mg by mouth 2 (two) times daily.    . Eluxadoline (VIBERZI PO) Take by mouth.     . famotidine (PEPCID) 20 MG tablet Take 1 tablet (20 mg total) by mouth at bedtime. 30 tablet 3  . fenofibrate 160 MG tablet     . FENOFIBRATE PO Take by mouth.    . fluticasone (FLONASE) 50 MCG/ACT nasal spray Place 1 spray into both nostrils daily. 16 g 5  . gabapentin (NEURONTIN) 400 MG capsule     . GABAPENTIN PO Take by mouth.    . hyoscyamine (ANASPAZ)  0.125 MG TBDP disintergrating tablet Place 0.125 mg under the tongue.    Marland Kitchen ipratropium (ATROVENT) 0.06 % nasal spray Place 2 sprays into both nostrils 3 (three) times daily as needed for rhinitis. 15 mL 5  . lactobacillus acidophilus (BACID) TABS tablet Take 1 tablet by mouth daily.    . methocarbamol (ROBAXIN) 500 MG tablet Take 1 tablet (500 mg total) by mouth every 8 (eight) hours as needed for muscle spasms. 8 tablet 0  . montelukast (SINGULAIR) 10 MG tablet Take 1 tablet (10 mg total) by mouth at bedtime. 30 tablet 11  . Omega-3 Fatty Acids (FISH OIL) 1000 MG CAPS Take 1,000 mg by mouth.    . predniSONE (DELTASONE) 10 MG tablet Take prednisone 40mg  daily x 2 days, 30mg  daily x 2 days, 20mg  daily x 2 days and 10mg  daily x 2 days. 20 tablet 0  . RABEprazole (ACIPHEX) 20 MG tablet     . Sodium Sulfate-Mag Sulfate-KCl (SUTAB) [phone] MG TABS Take 24 tablets by mouth as directed. BIN: [id number] PCN: CN GROUP: [id number] MEMBER ID: [id number];HUD AS CASH;NO PRIOR AUTHORIZATION 24 tablet 0  . venlafaxine XR (EFFEXOR-XR) 150 MG 24 hr capsule      No current facility-administered medications on file prior to visit.     PAST SURGICAL HISTORY Past Surgical History:  Procedure Laterality Date  . ABDOMINAL HYSTERECTOMY    . APPENDECTOMY    . BACK SURGERY    . COLONOSCOPY    . OVARIAN CYST REMOVAL      FAMILY HISTORY: Family History  Problem Relation Age of Onset  . Colon cancer Mother 35  . Stroke Father   . Colon cancer Nephew 5  . Colon polyps Neg Hx   . Esophageal cancer Neg Hx   . Rectal cancer Neg Hx   . Stomach cancer Neg Hx   . Allergic rhinitis Neg Hx   . Angioedema Neg Hx   . Asthma Neg Hx   . Atopy Neg Hx   . Eczema Neg Hx   . Immunodeficiency Neg Hx   . Urticaria Neg Hx     SOCIAL HISTORY:  reports that she has never smoked. She has never used smokeless tobacco. She reports current alcohol use. She reports that she does not use drugs.  PERFORMANCE STATUS: The  patient's performance status is 1 - Symptomatic but completely ambulatory  PHYSICAL EXAM: Most Recent Vital Signs: There were no vitals taken for this visit. BP 112/68 (BP Location: Left Arm, Patient Position: Sitting)   Pulse 88   Temp (!) 97.5 F (36.4 C) (Oral)   Resp 19   Ht 5\' 4"  (1.626 m)   Wt 152 lb 6.4 oz (  69.1 kg)   SpO2 99%   BMI 26.16 kg/m   General Appearance:    Alert, cooperative, no distress, appears stated age  Head:    Normocephalic, without obvious abnormality, atraumatic  Eyes:    PERRL, conjunctiva/corneas clear, EOM's intact, fundi    benign, both eyes        Throat:   Lips, mucosa, and tongue normal; teeth and gums normal  Neck:   Supple, symmetrical, trachea midline, no adenopathy;    thyroid:  no enlargement/tenderness/nodules; no carotid   bruit or JVD  Back:     Symmetric, no curvature, ROM normal, no CVA tenderness  Lungs:     Clear to auscultation bilaterally, respirations unlabored  Chest Wall:    No tenderness or deformity   Heart:    Regular rate and rhythm, S1 and S2 normal, no murmur, rub   or gallop     Abdomen:     Soft, non-tender, bowel sounds active all four quadrants,    no masses, no organomegaly        Extremities:   Extremities normal, atraumatic, no cyanosis or edema  Pulses:   2+ and symmetric all extremities  Skin:   Skin color, texture, turgor normal, no rashes or lesions  Lymph nodes:   Cervical, supraclavicular, and axillary nodes normal  Neurologic:   CNII-XII intact, normal strength, sensation and reflexes    throughout    LABORATORY DATA:  Results for orders placed or performed in visit on 02/05/20 (from the past 48 hour(s))  CBC with Differential ([place] Only)     Status: Abnormal   Collection Time: 02/05/20  2:51 PM  Result Value Ref Range   WBC Count 8.5 4.0 - 10.5 K/uL   RBC 4.57 3.87 - 5.11 MIL/uL   Hemoglobin 13.3 12.0 - 15.0 g/dL   HCT 41.6 36 - 46 %   MCV 91.0 80.0 - 100.0 fL   MCH 29.1 26.0 - 34.0  pg   MCHC 32.0 30.0 - 36.0 g/dL   RDW 14.0 11.5 - 15.5 %   Platelet Count 558 (H) 150 - 400 K/uL   nRBC 0.0 0.0 - 0.2 %   Neutrophils Relative % 50 %   Neutro Abs 4.3 1.7 - 7.7 K/uL   Lymphocytes Relative 32 %   Lymphs Abs 2.7 0.7 - 4.0 K/uL   Monocytes Relative 10 %   Monocytes Absolute 0.8 0.1 - 1.0 K/uL   Eosinophils Relative 7 %   Eosinophils Absolute 0.6 (H) 0.0 - 0.5 K/uL   Basophils Relative 1 %   Basophils Absolute 0.1 0.0 - 0.1 K/uL   Immature Granulocytes 0 %   Abs Immature Granulocytes 0.03 0.00 - 0.07 K/uL    Comment: Performed at [facility] at [facility], [address] [place], [place] [address]  [facility])     Status: None   Collection Time: 02/05/20  2:51 PM  Result Value Ref Range   Smear Review SMEAR STAINED AND AVAILABLE FOR REVIEW     Comment: Performed at [facility], [address] [place], [place] [address]      RADIOGRAPHY: No results found.     PATHOLOGY: None  ASSESSMENT/PLAN: Ms. Antunes is a very pleasant 64 yo caucasian female with a long history of elevated platelets.  She is doing well but does have fatigue at times.  She does not remember ever having JAK-2 testing done  with previous work-up years ago.  We will have her come back in next week for lab only and draw JAK-2.  Iron studies are pending. We will see if she is deficient.  Follow-up in 3 months.   All questions were answered and she is in agreement with the plan. She was encouraged to contact our office with any questions or concerns. We can certainly see her sooner if needed.   Laverna Peace, NP

## 2020-02-10 ENCOUNTER — Telehealth: Payer: Self-pay | Admitting: Family

## 2020-02-10 DIAGNOSIS — Z780 Asymptomatic menopausal state: Secondary | ICD-10-CM | POA: Diagnosis not present

## 2020-02-10 DIAGNOSIS — N959 Unspecified menopausal and perimenopausal disorder: Secondary | ICD-10-CM | POA: Diagnosis not present

## 2020-02-10 DIAGNOSIS — M810 Age-related osteoporosis without current pathological fracture: Secondary | ICD-10-CM | POA: Diagnosis not present

## 2020-02-10 DIAGNOSIS — Z1231 Encounter for screening mammogram for malignant neoplasm of breast: Secondary | ICD-10-CM | POA: Diagnosis not present

## 2020-02-10 NOTE — Telephone Encounter (Signed)
Appointments scheduled patient notified and calendar was mailed per 11/24 los

## 2020-02-12 ENCOUNTER — Telehealth: Payer: Self-pay

## 2020-02-12 NOTE — Telephone Encounter (Signed)
Pt called to check on lab appt as she had not checked her vm, she req a later time-appt moved.Marland KitchenMarland KitchenAOM

## 2020-02-14 ENCOUNTER — Inpatient Hospital Stay: Payer: Medicare PPO

## 2020-02-14 ENCOUNTER — Other Ambulatory Visit: Payer: Self-pay

## 2020-02-14 ENCOUNTER — Inpatient Hospital Stay: Payer: Medicare PPO | Attending: Hematology & Oncology

## 2020-02-14 DIAGNOSIS — D509 Iron deficiency anemia, unspecified: Secondary | ICD-10-CM | POA: Diagnosis not present

## 2020-02-14 DIAGNOSIS — D473 Essential (hemorrhagic) thrombocythemia: Secondary | ICD-10-CM | POA: Diagnosis not present

## 2020-02-21 LAB — JAK 2 V617F (GENPATH)

## 2020-02-24 ENCOUNTER — Telehealth: Payer: Self-pay | Admitting: *Deleted

## 2020-02-24 NOTE — Telephone Encounter (Signed)
-----   Message from Eliezer Bottom, NP sent at 02/24/2020 10:02 AM EST ----- JAK 2 testing was normal!!! WOO HOO!!!! Her iron saturation is on the low end of normal. She can try taking a supplement (over the counter) daily or every other day if that is better tolerated. Low iron can cause reactive thrombocytosis. We will recheck her counts in February at her follow-up.   Lisa Graham  ----- Message ----- From: Buel Ream, [facility] [place] Sent: 02/21/2020  10:22 AM EST To: Eliezer Bottom, NP

## 2020-02-24 NOTE — Telephone Encounter (Signed)
As noted below by Judson [place]och [place],NP, I informed her that the JAK 2 testing was normal. Your iron saturation is on the low end of normal. You can try taking Ferrous Sulfate 325 mg and its over the counter. Try it daily or every other day, whichever you tolerate better. Low iron can cause reactive thrombocytosis. We will recheck your counts in February. She verbalized understanding.

## 2020-02-28 DIAGNOSIS — M797 Fibromyalgia: Secondary | ICD-10-CM | POA: Diagnosis not present

## 2020-02-28 DIAGNOSIS — K580 Irritable bowel syndrome with diarrhea: Secondary | ICD-10-CM | POA: Diagnosis not present

## 2020-02-28 DIAGNOSIS — F331 Major depressive disorder, recurrent, moderate: Secondary | ICD-10-CM | POA: Diagnosis not present

## 2020-02-28 DIAGNOSIS — F419 Anxiety disorder, unspecified: Secondary | ICD-10-CM | POA: Diagnosis not present

## 2020-02-28 DIAGNOSIS — M1990 Unspecified osteoarthritis, unspecified site: Secondary | ICD-10-CM | POA: Diagnosis not present

## 2020-02-28 DIAGNOSIS — Z23 Encounter for immunization: Secondary | ICD-10-CM | POA: Diagnosis not present

## 2020-02-28 DIAGNOSIS — M5090 Cervical disc disorder, unspecified, unspecified cervical region: Secondary | ICD-10-CM | POA: Diagnosis not present

## 2020-02-28 DIAGNOSIS — E785 Hyperlipidemia, unspecified: Secondary | ICD-10-CM | POA: Diagnosis not present

## 2020-03-03 DIAGNOSIS — H04123 Dry eye syndrome of bilateral lacrimal glands: Secondary | ICD-10-CM | POA: Diagnosis not present

## 2020-03-06 ENCOUNTER — Other Ambulatory Visit: Payer: Self-pay | Admitting: Allergy & Immunology

## 2020-04-29 DIAGNOSIS — H16021 Ring corneal ulcer, right eye: Secondary | ICD-10-CM | POA: Diagnosis not present

## 2020-04-30 DIAGNOSIS — H16021 Ring corneal ulcer, right eye: Secondary | ICD-10-CM | POA: Diagnosis not present

## 2020-05-04 DIAGNOSIS — H16021 Ring corneal ulcer, right eye: Secondary | ICD-10-CM | POA: Diagnosis not present

## 2020-05-06 ENCOUNTER — Other Ambulatory Visit: Payer: Self-pay | Admitting: Family

## 2020-05-06 DIAGNOSIS — D75839 Thrombocytosis, unspecified: Secondary | ICD-10-CM

## 2020-05-06 DIAGNOSIS — D509 Iron deficiency anemia, unspecified: Secondary | ICD-10-CM

## 2020-05-07 ENCOUNTER — Inpatient Hospital Stay: Payer: Medicare PPO | Attending: Hematology & Oncology

## 2020-05-07 ENCOUNTER — Inpatient Hospital Stay: Payer: Medicare PPO | Admitting: Family

## 2020-05-19 ENCOUNTER — Other Ambulatory Visit: Payer: Self-pay | Admitting: Allergy & Immunology

## 2020-06-08 DIAGNOSIS — H04123 Dry eye syndrome of bilateral lacrimal glands: Secondary | ICD-10-CM | POA: Diagnosis not present

## 2020-06-26 ENCOUNTER — Other Ambulatory Visit: Payer: Self-pay | Admitting: Internal Medicine

## 2020-07-09 ENCOUNTER — Ambulatory Visit: Payer: Medicare PPO | Admitting: Allergy & Immunology

## 2020-07-28 ENCOUNTER — Encounter: Payer: Self-pay | Admitting: Allergy & Immunology

## 2020-07-28 ENCOUNTER — Other Ambulatory Visit: Payer: Self-pay

## 2020-07-28 ENCOUNTER — Ambulatory Visit: Payer: Medicare PPO | Admitting: Allergy & Immunology

## 2020-07-28 VITALS — BP 100/62 | HR 76 | Temp 97.3°F | Resp 16 | Ht 64.0 in | Wt 153.2 lb

## 2020-07-28 DIAGNOSIS — R059 Cough, unspecified: Secondary | ICD-10-CM | POA: Diagnosis not present

## 2020-07-28 DIAGNOSIS — J302 Other seasonal allergic rhinitis: Secondary | ICD-10-CM | POA: Diagnosis not present

## 2020-07-28 DIAGNOSIS — J3089 Other allergic rhinitis: Secondary | ICD-10-CM | POA: Diagnosis not present

## 2020-07-28 DIAGNOSIS — D721 Eosinophilia, unspecified: Secondary | ICD-10-CM

## 2020-07-28 DIAGNOSIS — D75839 Thrombocytosis, unspecified: Secondary | ICD-10-CM | POA: Diagnosis not present

## 2020-07-28 MED ORDER — IPRATROPIUM BROMIDE 0.06 % NA SOLN
2.0000 | Freq: Three times a day (TID) | NASAL | 5 refills | Status: DC | PRN
Start: 2020-07-28 — End: 2021-03-16

## 2020-07-28 NOTE — Progress Notes (Addendum)
FOLLOW UP  Date of Service/Encounter:  07/28/20   Assessment:   Seasonal and perennial allergic rhinitis(ragweed, weeds and indoor molds)  Cough - starting an asthma controller medication today to see if this helps (NO history of smoking)   Elevated eosinophils - ranging from 500-900  Thrombocytosis  Left neck mass - getting thyroid studies today  Plan/Recommendations:   1. Chronic rhinitis (ragweed, weeds and indoor molds) - Continue taking: Flonase one spray per nostril twice daily and Zyrtec (cetirizine) 10mg  tablet once daily and nasal ipratrpopium one spray per nostril three times daily (this can be OVER drying, so be careful). - Consider allergy shots as a means of long-term control. - Allergy shots "re-train" and "reset" the immune system to ignore environmental allergens and decrease the resulting immune response to those allergens (sneezing, itchy watery eyes, runny nose, nasal congestion, etc).    - Allergy shots improve symptoms in 75-85% of patients.   2. Cough  - Spirometry (lung testing) looks fairly good. - We are going to try you on a daily inhaled asthma medication (samples of Breztri provided). Judithann Sauger contains three medications to help control your breathing. - Use two puffs twice daily with the spacer. - Spacer sample and teaching provided. - Daily controller medication(s): Breztri two puffs twice daily with spacer - Prior to physical activity: albuterol 2 puffs 10-15 minutes before physical activity. - Rescue medications: albuterol 4 puffs every 4-6 hours as needed - Asthma control goals:  * Full participation in all desired activities (may need albuterol before activity) * Albuterol use two time or less a week on average (not counting use with activity) * Cough interfering with sleep two time or less a month * Oral steroids no more than once a year * No hospitalizations  3. Elevated eosinophils and platelets - We are rechecking your complete  blood count today. - We will route the results to your Oncology NP to keep her in the loop. - They might want to see you again.   4. Return in about 6 weeks (around 09/08/2020).    Subjective:   Lisa Graham is a 65 y.o. female presenting today for follow up of  Chief Complaint  Patient presents with  . Allergic Rhinitis     Lisa Graham has a history of the following: Patient Active Problem List   Diagnosis Date Noted  . Seasonal and perennial allergic rhinitis 11/28/2019  . Gastroesophageal reflux disease 09/27/2019  . Cough 09/27/2019  . Seasonal allergic rhinitis due to pollen 09/27/2019  . Anxiety 09/26/2019  . Cervical disc disease 09/26/2019  . Depression 09/26/2019  . Diverticulitis 09/26/2019  . GERD (gastroesophageal reflux disease) 09/26/2019  . Hyperlipidemia 09/26/2019  . Intestinal obstruction ([place]) 09/26/2019    History obtained from: chart review and patient.  Lisa Graham is a 65 y.o. female presenting for a follow up visit.  She was last seen in October 2021.  At that time, we continue with Flonase as well as Zyrtec and nasal Atrovent.  We did discuss allergy shots as a means of long-term control.  We also talked about an ENT evaluation for her rhinitis.  Her cough had improved.  We recommended continuing the antibiotic that she was already on.  We did get a repeat CBC and her eosinophils have decreased to 500 from 900.  She continued to have elevated platelets.  Since last visit, she has mostly done well.  Asthma/Respiratory Symptom History: She continues to have a cough that is worse  in the mornings. She does cough a couple of times and it clears up. It will act up during the day sometimes. She has had a normal spirometry in July 2021. She will be hoarse occasionally and have coughing with talking too much. We have given her prednisone and antibiotics in the past.   Allergic Rhinitis Symptom History: She is on the ipratropium nasal spray one spray per  nostril up to twice daily. She also has Flonase that she uses occasionally. She has cetirizine to use as well. She has not needed her antibiotics at all.   JAK-2 testing was all normal. She had low normal iron saturation. She did start taking iron after that. She is taking it every day. She does not have a follow up visit with oncology, although per the last note she was supposed to follow up in March 2022 or so.    She is going to [facility] after this visit today. One of her friends recently had knee surgery.   Otherwise, there have been no changes to her past medical history, surgical history, family history, or social history.    Review of Systems  Constitutional: Negative.  Negative for chills, fever, malaise/fatigue and weight loss.  HENT: Positive for congestion and sinus pain. Negative for ear discharge and ear pain.   Eyes: Negative for pain, discharge and redness.  Respiratory: Negative for cough, sputum production, shortness of breath and wheezing.   Cardiovascular: Negative.  Negative for chest pain and palpitations.  Gastrointestinal: Negative for abdominal pain, constipation, diarrhea, heartburn, nausea and vomiting.  Skin: Negative.  Negative for itching and rash.  Neurological: Negative for dizziness and headaches.  Endo/Heme/Allergies: Positive for environmental allergies. Does not bruise/bleed easily.       Objective:   Blood pressure 100/62, pulse 76, temperature (!) 97.3 F (36.3 C), temperature source Temporal, resp. rate 16, height 5\' 4"  (1.626 m), weight 153 lb 3.2 oz (69.5 kg), SpO2 96 %. Body mass index is 26.3 kg/m.   Physical Exam:  Physical Exam Constitutional:      Appearance: She is well-developed.     Comments: Very friendly. Clearing through through the entire dinner.   HENT:     Head: Normocephalic and atraumatic.     Right Ear: Tympanic membrane, ear canal and external ear normal.     Left Ear: Tympanic membrane, ear canal and external ear  normal.     Nose: Mucosal edema and rhinorrhea present. No nasal deformity or septal deviation.     Right Turbinates: Enlarged and swollen.     Left Turbinates: Enlarged and swollen.     Right Sinus: No maxillary sinus tenderness or frontal sinus tenderness.     Left Sinus: No maxillary sinus tenderness or frontal sinus tenderness.     Comments: No nasal polyps.     Mouth/Throat:     Mouth: Mucous membranes are not pale and not dry.     Pharynx: Uvula midline.  Eyes:     General:        Right eye: No discharge.        Left eye: No discharge.     Conjunctiva/sclera: Conjunctivae normal.     Right eye: Right conjunctiva is not injected. No chemosis.    Left eye: Left conjunctiva is not injected. No chemosis.    Pupils: Pupils are equal, round, and reactive to light.  Cardiovascular:     Rate and Rhythm: Normal rate and regular rhythm.     Heart sounds: Normal  heart sounds.  Pulmonary:     Effort: Pulmonary effort is normal. No tachypnea, accessory muscle usage or respiratory distress.     Breath sounds: Normal breath sounds. No wheezing, rhonchi or rales.     Comments: Moving air well in all lung fields. No increased work of breathing noted. She does have some coarse upper airway noise throughout.  Chest:     Chest wall: No tenderness.  Lymphadenopathy:     Cervical: No cervical adenopathy.  Skin:    General: Skin is warm.     Capillary Refill: Capillary refill takes less than 2 seconds.     Coloration: Skin is not pale.     Findings: No abrasion, erythema, petechiae or rash. Rash is not papular, urticarial or vesicular.     Comments: No eczematous or urticarial lesions noted.   Neurological:     Mental Status: She is alert.  Psychiatric:        Behavior: Behavior is cooperative.      Diagnostic studies:    Spirometry: results normal (FEV1: 2.21/93%, FVC: 2.72/87%, FEV1/FVC: 81%).    Spirometry consistent with normal pattern.    Allergy Studies: none       Salvatore Marvel, MD  [facility] [place] [facility] [place]

## 2020-07-28 NOTE — Patient Instructions (Addendum)
1. Chronic rhinitis (ragweed, weeds and indoor molds) - Continue taking: Flonase one spray per nostril twice daily and Zyrtec (cetirizine) 10mg  tablet once daily and nasal ipratrpopium one spray per nostril three times daily (this can be OVER drying, so be careful). - Consider allergy shots as a means of long-term control. - Allergy shots "re-train" and "reset" the immune system to ignore environmental allergens and decrease the resulting immune response to those allergens (sneezing, itchy watery eyes, runny nose, nasal congestion, etc).    - Allergy shots improve symptoms in 75-85% of patients.   2. Cough  - Spirometry (lung testing) looks fairly good. - We are going to try you on a daily inhaled asthma medication (samples of Breztri provided). Judithann Sauger contains three medications to help control your breathing. - Use two puffs twice daily with the spacer. - Spacer sample and teaching provided. - Daily controller medication(s): Breztri two puffs twice daily with spacer - Prior to physical activity: albuterol 2 puffs 10-15 minutes before physical activity. - Rescue medications: albuterol 4 puffs every 4-6 hours as needed - Asthma control goals:  * Full participation in all desired activities (may need albuterol before activity) * Albuterol use two time or less a week on average (not counting use with activity) * Cough interfering with sleep two time or less a month * Oral steroids no more than once a year * No hospitalizations  3. Elevated eosinophils and platelets - We are rechecking your complete blood count today. - We will route the results to your Oncology NP to keep her in the loop. - They might want to see you again.   4. Return in about 6 weeks (around 09/08/2020).    Please inform us of any Emergency Department visits, hospitalizations, or changes in symptoms. Call us before going to the ED for breathing or allergy symptoms since we might be able to fit you in for a sick visit.  Feel free to contact us anytime with any questions, problems, or concerns.  It was a pleasure to see you again today!  Websites that have reliable patient information: 1. American Academy of Asthma, Allergy, and Immunology: www.aaaai.org 2. Food Allergy Research and Education (FARE): foodallergy.org 3. Mothers of Asthmatics: http://www.asthmacommunitynetwork.org 4. American College of Allergy, Asthma, and Immunology: www.acaai.org   COVID-19 Vaccine Information can be found at: ShippingScam.co.uk For questions related to vaccine distribution or appointments, please email vaccine@[email or web address] [email or web address] or call 7196299800.   We realize that you might be concerned about having an allergic reaction to the COVID19 vaccines. To help with that concern, WE ARE OFFERING THE COVID19 VACCINES IN OUR OFFICE! Ask the front desk for dates!     "Like" [place] on Facebook and Instagram for our latest updates!      A healthy democracy works best when [employer] participate! Make sure you are registered to vote! If you have moved or changed any of your contact information, you will need to get this updated before voting!  In some cases, you MAY be able to register to vote online: [email or web address]

## 2020-07-29 LAB — CBC WITH DIFFERENTIAL
Basophils Absolute: 0.1 10*3/uL (ref 0.0–0.2)
Basos: 1 %
EOS (ABSOLUTE): 0.6 10*3/uL — ABNORMAL HIGH (ref 0.0–0.4)
Eos: 8 %
Hematocrit: 39.1 % (ref 34.0–46.6)
Hemoglobin: 12.9 g/dL (ref 11.1–15.9)
Immature Grans (Abs): 0 10*3/uL (ref 0.0–0.1)
Immature Granulocytes: 0 %
Lymphocytes Absolute: 2.5 10*3/uL (ref 0.7–3.1)
Lymphs: 33 %
MCH: 29.1 pg (ref 26.6–33.0)
MCHC: 33 g/dL (ref 31.5–35.7)
MCV: 88 fL (ref 79–97)
Monocytes Absolute: 1 10*3/uL — ABNORMAL HIGH (ref 0.1–0.9)
Monocytes: 13 %
Neutrophils Absolute: 3.6 10*3/uL (ref 1.4–7.0)
Neutrophils: 45 %
RBC: 4.43 x10E6/uL (ref 3.77–5.28)
RDW: 13.3 % (ref 11.7–15.4)
WBC: 7.7 10*3/uL (ref 3.4–10.8)

## 2020-07-30 ENCOUNTER — Telehealth: Payer: Self-pay | Admitting: Allergy & Immunology

## 2020-07-30 NOTE — Telephone Encounter (Signed)
Patient was returning a call for Lisa Graham in  reference to Blood Work please advise

## 2020-07-30 NOTE — Telephone Encounter (Signed)
Spoke with patient, message addressed.

## 2020-08-03 DIAGNOSIS — H20041 Secondary noninfectious iridocyclitis, right eye: Secondary | ICD-10-CM | POA: Diagnosis not present

## 2020-08-03 DIAGNOSIS — S0501XA Injury of conjunctiva and corneal abrasion without foreign body, right eye, initial encounter: Secondary | ICD-10-CM | POA: Diagnosis not present

## 2020-08-06 DIAGNOSIS — H20041 Secondary noninfectious iridocyclitis, right eye: Secondary | ICD-10-CM | POA: Diagnosis not present

## 2020-08-31 DIAGNOSIS — F419 Anxiety disorder, unspecified: Secondary | ICD-10-CM | POA: Diagnosis not present

## 2020-08-31 DIAGNOSIS — E785 Hyperlipidemia, unspecified: Secondary | ICD-10-CM | POA: Diagnosis not present

## 2020-08-31 DIAGNOSIS — Z1211 Encounter for screening for malignant neoplasm of colon: Secondary | ICD-10-CM | POA: Diagnosis not present

## 2020-08-31 DIAGNOSIS — Z23 Encounter for immunization: Secondary | ICD-10-CM | POA: Diagnosis not present

## 2020-08-31 DIAGNOSIS — K580 Irritable bowel syndrome with diarrhea: Secondary | ICD-10-CM | POA: Diagnosis not present

## 2020-08-31 DIAGNOSIS — M5090 Cervical disc disorder, unspecified, unspecified cervical region: Secondary | ICD-10-CM | POA: Diagnosis not present

## 2020-08-31 DIAGNOSIS — M797 Fibromyalgia: Secondary | ICD-10-CM | POA: Diagnosis not present

## 2020-08-31 DIAGNOSIS — F331 Major depressive disorder, recurrent, moderate: Secondary | ICD-10-CM | POA: Diagnosis not present

## 2020-08-31 DIAGNOSIS — M1990 Unspecified osteoarthritis, unspecified site: Secondary | ICD-10-CM | POA: Diagnosis not present

## 2020-09-02 ENCOUNTER — Encounter: Payer: Self-pay | Admitting: Gastroenterology

## 2020-09-08 ENCOUNTER — Ambulatory Visit: Payer: Medicare PPO | Admitting: Allergy & Immunology

## 2020-09-23 DIAGNOSIS — H16021 Ring corneal ulcer, right eye: Secondary | ICD-10-CM | POA: Diagnosis not present

## 2020-09-27 ENCOUNTER — Other Ambulatory Visit: Payer: Self-pay | Admitting: Internal Medicine

## 2020-10-06 ENCOUNTER — Encounter: Payer: Self-pay | Admitting: Gastroenterology

## 2020-10-08 ENCOUNTER — Encounter: Payer: Self-pay | Admitting: Gastroenterology

## 2020-10-13 NOTE — Telephone Encounter (Signed)
.  A user error has taken place: error

## 2020-10-16 ENCOUNTER — Telehealth: Payer: Self-pay | Admitting: Allergy & Immunology

## 2020-10-16 NOTE — Telephone Encounter (Signed)
Left Rechell a message letting her know that we will leave her samples up front for her to pick up.

## 2020-10-16 NOTE — Telephone Encounter (Signed)
Patient states she received Breztri samples before and was wondering if she could have more samples or a refill sent in to the [facility] in [place] to help her until her next OV. Patient has an OV scheduled for 11/10/20.

## 2020-11-10 ENCOUNTER — Ambulatory Visit: Payer: Medicare PPO | Admitting: Allergy & Immunology

## 2020-11-10 ENCOUNTER — Encounter: Payer: Self-pay | Admitting: Allergy & Immunology

## 2020-11-10 ENCOUNTER — Other Ambulatory Visit: Payer: Self-pay

## 2020-11-10 VITALS — BP 108/60 | HR 96 | Temp 98.0°F | Resp 18 | Ht 64.0 in | Wt 154.2 lb

## 2020-11-10 DIAGNOSIS — J302 Other seasonal allergic rhinitis: Secondary | ICD-10-CM

## 2020-11-10 DIAGNOSIS — J3089 Other allergic rhinitis: Secondary | ICD-10-CM

## 2020-11-10 DIAGNOSIS — D75839 Thrombocytosis, unspecified: Secondary | ICD-10-CM

## 2020-11-10 DIAGNOSIS — R059 Cough, unspecified: Secondary | ICD-10-CM | POA: Diagnosis not present

## 2020-11-10 NOTE — Patient Instructions (Addendum)
1. Chronic rhinitis (ragweed, weeds and indoor molds) - Continue taking: Flonase one spray per nostril twice daily and Zyrtec (cetirizine) '10mg'$  tablet once daily and nasal ipratrpopium one spray per nostril three times daily (this can be OVER drying, so be careful). - We are going to see you to ENT so that they can look down your throat to see what might be going on.  - Consider allergy shots as a means of long-term control. - Allergy shots "re-train" and "reset" the immune system to ignore environmental allergens and decrease the resulting immune response to those allergens (sneezing, itchy watery eyes, runny nose, nasal congestion, etc).    - Allergy shots improve symptoms in 75-85% of patients.   2. Cough  - Spirometry (lung testing) not done since it has been normal in the past. - STOP the Breztri since it did not help your coughing.  - I do not think that this is related to asthma at  all since there was no improvement with the daily inhaler.  - We are going to refer you to see ENT for an evaluation of this cough.  - Stop the current reflux medication and start Protonix '40mg'$  daily AND Pepcid '40mg'$  TWICE DAILY.   3. Return in about 4 months (around 03/12/2021).    Please inform us of any Emergency Department visits, hospitalizations, or changes in symptoms. Call us before going to the ED for breathing or allergy symptoms since we might be able to fit you in for a sick visit. Feel free to contact us anytime with any questions, problems, or concerns.  It was a pleasure to see you again today!  Websites that have reliable patient information: 1. American Academy of Asthma, Allergy, and Immunology: www.aaaai.org 2. Food Allergy Research and Education (FARE): foodallergy.org 3. Mothers of Asthmatics: http://www.asthmacommunitynetwork.org 4. American College of Allergy, Asthma, and Immunology: www.acaai.org   COVID-19 Vaccine Information can be found at:  ShippingScam.co.uk For questions related to vaccine distribution or appointments, please email [email or web address] or call [phone].   We realize that you might be concerned about having an allergic reaction to the COVID19 vaccines. To help with that concern, WE ARE OFFERING THE COVID19 VACCINES IN OUR OFFICE! Ask the front desk for dates!     "Like" [place] on Facebook and Instagram for our latest updates!      A healthy democracy works best when [employer] participate! Make sure you are registered to vote! If you have moved or changed any of your contact information, you will need to get this updated before voting!  In some cases, you MAY be able to register to vote online: [email or web address]

## 2020-11-10 NOTE — Progress Notes (Signed)
FOLLOW UP  Date of Service/Encounter:  11/10/20   Assessment:   Seasonal and perennial allergic rhinitis (ragweed, weeds and indoor molds)   Cough - starting an asthma controller medication today to see if this helps (NO history of smoking)    Elevated eosinophils - ranging from 500-900 (cleared by Hematology/Oncology)   Thrombocytosis - improving (cleared by Hematology/Oncology)   It is still unclear where her cough is coming from.  Despite the use of the [place], she continues to have it.  She does feel that her cough has improved since she started seeing [place], but it has not resolved completely.  She did have a normal chest x-ray in July 2021 and she has been followed by pulmonology.  We are going to change her reflux medications around to see if this helps.  I do not think continuing with Judithann Sauger is useful at this point since she continued to have cough despite the use of it.  We are going to refer her to ENT so that they can do a laryngoscopy and give their input on the cough.  Control of her postnasal drip has not seem to help either.  Plan/Recommendations:   1. Chronic rhinitis (ragweed, weeds and indoor molds) - Continue taking: Flonase one spray per nostril twice daily and Zyrtec (cetirizine) '10mg'$  tablet once daily and nasal ipratrpopium one spray per nostril three times daily (this can be OVER drying, so be careful). - We are going to see you to ENT so that they can look down your throat to see what might be going on.  - Consider allergy shots as a means of long-term control. - Allergy shots "re-train" and "reset" the immune system to ignore environmental allergens and decrease the resulting immune response to those allergens (sneezing, itchy watery eyes, runny nose, nasal congestion, etc).    - Allergy shots improve symptoms in 75-85% of patients.   2. Cough  - Spirometry (lung testing) not done since it has been normal in the past. - STOP the Breztri since it did not help  your coughing.  - I do not think that this is related to asthma at  all since there was no improvement with the daily inhaler.  - We are going to refer you to see ENT for an evaluation of this cough.  - Stop the current reflux medication and start Protonix '40mg'$  daily AND Pepcid '40mg'$  TWICE DAILY.   3. Return in about 4 months (around 03/12/2021).    Subjective:   SHU LACOCK is a 65 y.o. female presenting today for follow up of  Chief Complaint  Patient presents with   Follow-up    Patient in for a follow up today and she thinks her cough is worse.     [name] has a history of the following: Patient Active Problem List   Diagnosis Date Noted   Seasonal and perennial allergic rhinitis 11/28/2019   Gastroesophageal reflux disease 09/27/2019   Cough 09/27/2019   Seasonal allergic rhinitis due to pollen 09/27/2019   Anxiety 09/26/2019   Cervical disc disease 09/26/2019   Depression 09/26/2019   Diverticulitis 09/26/2019   GERD (gastroesophageal reflux disease) 09/26/2019   Hyperlipidemia 09/26/2019   Intestinal obstruction ([place]) 09/26/2019    History obtained from: chart review and patient.  Deania is a 65 y.o. female presenting for a follow up visit.  She was last seen in May 2022.  At that time, we continued to work on improving her postnasal drip control.  We continued the Flonase as well as Zyrtec.  We also continue nasal Atrovent.  Her spirometry looks good.  She was endorsing a cough.  We tried her on Breztri and gave her some samples.  For her elevated eosinophils and platelets, we rechecked her levels and they were stable or trending lower.  Since last visit, she has done fairly well.  Asthma/Respiratory Symptom History: She has Breztri and is using one puff twice daily to help things last longer. She does not think that the [place] helped with the coughing.  This is all "where the mucous is" and it sounds "like she has been smoking cigatrettes for 10-20  years. S he hs never been a smoker.  Despite all this, the cough continues.  She has seen pulmonology.  A chest x-ray in July 2021 was completely normal.  She denies any exposures.  Chest x-ray (July 2021) CLINICAL DATA:  Chronic cough.   EXAM: CHEST - 2 VIEW   COMPARISON:  June 10, 2019.   FINDINGS: The heart size and mediastinal contours are within normal limits. Both lungs are clear. The visualized skeletal structures are unremarkable.   IMPRESSION: No active cardiopulmonary disease.  Allergic Rhinitis Symptom History: She remains on her new sprays as well as her Zyrtec.  Her last testing was in September 2021 at which time she was mildly reactive to ragweed, weeds, and indoor molds.  We stopped her Claritin and continued her on Flonase twice daily.  We started Zyrtec and nasal Atrovent at that time.  She remains on all of these medications.   Her work-up for elevated eosinophils which is largely negative with a negative ANCA, negative stool ova and parasite, normal thyroid studies, and negative Strongyloides IgG.  She has not seen hematology/oncology since last visit.  Otherwise, there have been no changes to her past medical history, surgical history, family history, or social history.    Review of Systems  Constitutional: Negative.  Negative for chills, fever, malaise/fatigue and weight loss.  HENT:  Positive for congestion. Negative for ear discharge, ear pain and sinus pain.   Eyes:  Negative for pain, discharge and redness.  Respiratory:  Negative for cough, sputum production, shortness of breath and wheezing.   Cardiovascular: Negative.  Negative for chest pain and palpitations.  Gastrointestinal:  Negative for abdominal pain, constipation, diarrhea, heartburn, nausea and vomiting.  Skin: Negative.  Negative for itching and rash.  Neurological:  Negative for dizziness and headaches.  Endo/Heme/Allergies:  Positive for environmental allergies. Does not bruise/bleed easily.       Objective:   Blood pressure 108/60, pulse 96, temperature 98 F (36.7 C), temperature source Temporal, resp. rate 18, height '5\' 4"'$  (1.626 m), weight 154 lb 3.2 oz (69.9 kg), SpO2 97 %. Body mass index is 26.47 kg/m.   Physical Exam:  Physical Exam Vitals reviewed.  Constitutional:      Appearance: She is well-developed.     Comments: Talkative.  Did not cough during the visit.  HENT:     Head: Normocephalic and atraumatic.     Right Ear: Tympanic membrane, ear canal and external ear normal.     Left Ear: Tympanic membrane, ear canal and external ear normal.     Nose: No nasal deformity, septal deviation, mucosal edema or rhinorrhea.     Right Turbinates: Enlarged, swollen and pale.     Left Turbinates: Enlarged, swollen and pale.     Right Sinus: No maxillary sinus tenderness or frontal sinus tenderness.  Left Sinus: No maxillary sinus tenderness or frontal sinus tenderness.     Mouth/Throat:     Mouth: Mucous membranes are not pale and not dry.     Pharynx: Uvula midline.  Eyes:     General: Lids are normal. No allergic shiner.       Right eye: No discharge.        Left eye: No discharge.     Conjunctiva/sclera: Conjunctivae normal.     Right eye: Right conjunctiva is not injected. No chemosis.    Left eye: Left conjunctiva is not injected. No chemosis.    Pupils: Pupils are equal, round, and reactive to light.  Cardiovascular:     Rate and Rhythm: Normal rate and regular rhythm.     Heart sounds: Normal heart sounds.  Pulmonary:     Effort: Pulmonary effort is normal. No tachypnea, accessory muscle usage or respiratory distress.     Breath sounds: Normal breath sounds. No wheezing, rhonchi or rales.     Comments: Moving air well in all lung fields.  No increased work of breathing. Chest:     Chest wall: No tenderness.  Lymphadenopathy:     Cervical: No cervical adenopathy.  Skin:    General: Skin is warm.     Capillary Refill: Capillary refill takes less  than 2 seconds.     Coloration: Skin is not pale.     Findings: No abrasion, erythema, petechiae or rash. Rash is not papular, urticarial or vesicular.     Comments: No eczematous or urticarial lesions noted.  Neurological:     Mental Status: She is alert.  Psychiatric:        Behavior: Behavior is cooperative.     Diagnostic studies: none       Salvatore Marvel, MD  [facility]

## 2020-11-11 ENCOUNTER — Encounter: Payer: Self-pay | Admitting: Gastroenterology

## 2020-11-11 ENCOUNTER — Ambulatory Visit (INDEPENDENT_AMBULATORY_CARE_PROVIDER_SITE_OTHER): Payer: Medicare PPO | Admitting: Gastroenterology

## 2020-11-11 VITALS — BP 102/78 | HR 94 | Ht 64.0 in | Wt 153.4 lb

## 2020-11-11 DIAGNOSIS — Z800 Family history of malignant neoplasm of digestive organs: Secondary | ICD-10-CM | POA: Diagnosis not present

## 2020-11-11 DIAGNOSIS — K581 Irritable bowel syndrome with constipation: Secondary | ICD-10-CM

## 2020-11-11 DIAGNOSIS — R0989 Other specified symptoms and signs involving the circulatory and respiratory systems: Secondary | ICD-10-CM

## 2020-11-11 DIAGNOSIS — R221 Localized swelling, mass and lump, neck: Secondary | ICD-10-CM

## 2020-11-11 DIAGNOSIS — R198 Other specified symptoms and signs involving the digestive system and abdomen: Secondary | ICD-10-CM | POA: Diagnosis not present

## 2020-11-11 DIAGNOSIS — K5900 Constipation, unspecified: Secondary | ICD-10-CM | POA: Diagnosis not present

## 2020-11-11 DIAGNOSIS — K219 Gastro-esophageal reflux disease without esophagitis: Secondary | ICD-10-CM | POA: Diagnosis not present

## 2020-11-11 DIAGNOSIS — R09A2 Foreign body sensation, throat: Secondary | ICD-10-CM

## 2020-11-11 NOTE — Progress Notes (Signed)
Chief Complaint:   Referring Provider:  Nicoletta Dress, MD      ASSESSMENT AND PLAN;   #1. FH colon Ca ( mom at age 67, nephew at age 27)  #2. IBS-C  #3. GERD with globus sensation  Plan: -EGD/Colon with 2 day prep -Continue Aciphex 20 mg p.o. once a day. -Miralax 17g po qd to continue.    I discussed EGD/Colonoscopy- the indications, risks, alternatives and potential complications including, but not limited to, bleeding, infection, reaction to medication, damage to internal organs, cardiac and/or pulmonary problems, and perforation requiring surgery (1 to 2 in 1000). The possibility that significant findings could be missed was explained. All ? were answered. The patient gives consent to proceed.   HPI:    Lisa Graham is a 65 y.o. female  With chronic osteoarthritis, fibromyalgia, anxiety/depression  For GI evaluation.  Has strong family history of colon cancer  C/O Longstanding constipation with pellet-like stools.  Better with miralax.  Has been having occasional globus sensation with intermittent heartburn, despite Aciphex.  Seen by Dr. Delena Bali and advised EGD at the time of colon.  No nausea, vomiting, regurgitation, odynophagia or dysphagia.  No significant diarrhea.  No melena or hematochezia. No unintentional weight loss. No abdominal pain.  No sodas, chocolates, chewing gums, artificial sweeteners and candy. 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  Mild diverticulosis.  Otherwise grossly normal.  Prolonged exam.  Repeat in 5 years.  Earlier, for new problems. Past Medical History:  Diagnosis Date   Allergy    Anxiety    Arthritis    Bronchitis    2 months ago   Cancer ([place])    squamous cell skin cancer    Cyst of ovary    Depression    Depressive disorder    Elevated heart rate with elevated blood pressure  without diagnosis of hypertension    Family history of colon cancer in mother    Fibromyalgia    GERD (gastroesophageal reflux disease)    H/O degenerative disc disease    Hypertriglyceridemia    Osteoarthritis    PNA (pneumonia)    2 mos ago   Skin cancer of arm, left    Forearm    Past Surgical History:  Procedure Laterality Date   ABDOMINAL HYSTERECTOMY     APPENDECTOMY     BACK SURGERY     COLONOSCOPY  06/13/2014   Minimal sigmoid diverticulosis. Highly redundant colon. Otherwise grossly normal colonoscopy to cecum   OVARIAN CYST REMOVAL      Family History  Problem Relation Age of Onset   Colon cancer Mother 7   Stroke Father    Colon cancer Nephew 54   Colon polyps Neg Hx    Esophageal cancer Neg Hx    Rectal cancer Neg Hx    Stomach cancer Neg Hx    Allergic rhinitis Neg Hx    Angioedema Neg Hx    Asthma Neg Hx    Atopy Neg Hx    Eczema Neg Hx    Immunodeficiency Neg Hx    Urticaria Neg Hx     Social History   Tobacco Use   Smoking status: Never   Smokeless tobacco: Never  Vaping Use   Vaping Use: Never used  Substance Use Topics   Alcohol use: Yes    Comment: occ   Drug use: No  Current Outpatient Medications  Medication Sig Dispense Refill   Ascorbic Acid (VITAMIN C) 100 MG tablet Take 100 mg by mouth daily.     buPROPion (WELLBUTRIN SR) 150 MG 12 hr tablet      cetirizine (ZYRTEC) 10 MG tablet TAKE 1 TABLET BY MOUTH EVERY DAY 90 tablet 1   Citalopram Hydrobromide (CELEXA PO) Take by mouth.     clonazePAM (KLONOPIN) 0.5 MG tablet Take 1 tablet by mouth 2 (two) times daily as needed.     cyclobenzaprine (FLEXERIL) 10 MG tablet Take 10 mg by mouth 3 (three) times daily as needed for muscle spasms.     diclofenac (VOLTAREN) 75 MG EC tablet Take 75 mg by mouth 2 (two) times daily.     fenofibrate 160 MG tablet Take 160 mg by mouth daily.      gabapentin (NEURONTIN) 400 MG capsule      hyoscyamine (ANASPAZ) 0.125 MG TBDP disintergrating tablet  Place 0.125 mg under the tongue.     ipratropium (ATROVENT) 0.06 % nasal spray Place 2 sprays into both nostrils 3 (three) times daily as needed for rhinitis. 15 mL 5   lactobacillus acidophilus (BACID) TABS tablet Take 1 tablet by mouth daily.     montelukast (SINGULAIR) 10 MG tablet TAKE 1 TABLET BY MOUTH EVERYDAY AT BEDTIME 90 tablet 0   Omega-3 Fatty Acids (FISH OIL) 1000 MG CAPS Take 1,000 mg by mouth.     RABEprazole (ACIPHEX) 20 MG tablet      venlafaxine XR (EFFEXOR-XR) 150 MG 24 hr capsule      No current facility-administered medications for this visit.    No Known Allergies  Review of Systems:  Constitutional: Denies fever, chills, diaphoresis, appetite change and  has fatigue.  HEENT: Denies photophobia, eye pain, redness, hearing loss, ear pain, congestion, sore throat, rhinorrhea, sneezing, mouth sores, neck pain, neck stiffness and tinnitus.   Respiratory: Denies SOB, DOE, cough, chest tightness,  and wheezing.   Cardiovascular: Denies chest pain, palpitations and leg swelling.  Genitourinary: Denies dysuria, urgency, frequency, hematuria, flank pain and difficulty urinating.  Musculoskeletal: Has  myalgias, back pain, joint swelling, arthralgias and gait problem.  Skin: No rash.  Neurological: Denies dizziness, seizures, syncope, weakness, light-headedness, numbness and headaches.  Hematological: Denies adenopathy. Easy bruising, personal or family bleeding history  Psychiatric/Behavioral: Has  anxiety or depression     Physical Exam:    BP 102/78   Pulse 94   Ht '5\' 4"'$  (1.626 m)   Wt 153 lb 6 oz (69.6 kg)   SpO2 95%   BMI 26.33 kg/m  Wt Readings from Last 3 Encounters:  11/11/20 153 lb 6 oz (69.6 kg)  11/10/20 154 lb 3.2 oz (69.9 kg)  07/28/20 153 lb 3.2 oz (69.5 kg)   Constitutional:  Well-developed, in no acute distress. Psychiatric: Normal mood and affect. Behavior is normal. HEENT: Pupils normal.  Conjunctivae are normal. No scleral icterus. Neck  supple.  Cardiovascular: Normal rate, regular rhythm. No edema Pulmonary/chest: Effort normal and breath sounds normal. No wheezing, rales or rhonchi. Abdominal: Soft, nondistended. Nontender. Bowel sounds active throughout. There are no masses palpable. No hepatomegaly. Rectal: Deferred Neurological: Alert and oriented to person place and time. Skin: Skin is warm and dry. No rashes noted.  Data Reviewed: I have personally reviewed following labs and imaging studies  CBC: CBC Latest Ref Rng & Units 07/28/2020 02/05/2020 01/09/2020  WBC 3.4 - 10.8 x10E3/uL 7.7 8.5 7.5  Hemoglobin 11.1 - 15.9 g/dL 12.9 13.3  13.4  Hematocrit 34.0 - 46.6 % 39.1 41.6 41.7  Platelets 150 - 400 K/uL - 558(H) 604(H)    CMP: CMP Latest Ref Rng & Units 02/05/2020  Glucose 70 - 99 mg/dL 91  BUN 8 - 23 mg/dL 29(H)  Creatinine 0.44 - 1.00 mg/dL 1.19(H)  Sodium 135 - 145 mmol/L 138  Potassium 3.5 - 5.1 mmol/L 4.4  Chloride 98 - 111 mmol/L 103  CO2 22 - 32 mmol/L 26  Calcium 8.9 - 10.3 mg/dL 9.8  Total Protein 6.5 - 8.1 g/dL 8.0  Total Bilirubin 0.3 - 1.2 mg/dL 0.3  Alkaline Phos 38 - 126 U/L 49  AST 15 - 41 U/L 24  ALT 0 - 44 U/L 21    GFR: CrCl cannot be calculated (Patient's most recent lab result is older than the maximum 21 days allowed.). Liver Function Tests: No results for input(s): AST, ALT, ALKPHOS, BILITOT, PROT, ALBUMIN in the last 168 hours. No results for input(s): LIPASE, AMYLASE in the last 168 hours. No results for input(s): AMMONIA in the last 168 hours. Coagulation Profile: No results for input(s): INR, PROTIME in the last 168 hours. HbA1C: No results for input(s): HGBA1C in the last 72 hours. Lipid Profile: No results for input(s): CHOL, HDL, LDLCALC, TRIG, CHOLHDL, LDLDIRECT in the last 72 hours. Thyroid Function Tests: No results for input(s): TSH, T4TOTAL, FREET4, T3FREE, THYROIDAB in the last 72 hours. Anemia Panel: No results for input(s): VITAMINB12, FOLATE, FERRITIN, TIBC,  IRON, RETICCTPCT in the last 72 hours.  No results found for this or any previous visit (from the past 240 hour(s)).    Radiology Studies: No results found.    Carmell Austria, MD 11/11/2020, 11:25 AM  Cc: Nicoletta Dress, MD

## 2020-11-11 NOTE — Patient Instructions (Signed)
You have been scheduled for a colonoscopy. Please follow written instructions given to you at your visit today.  Please pick up your prep supplies at the pharmacy within the next 1-3 days. If you use inhalers (even only as needed), please bring them with you on the day of your procedure. (- Please use Suprep that you have at home.) You will need to purchase Miralax over the counter.    Please start Miralax - 17g ( 1 capful) daily in at least 8 ounces of water/juice daily.   If you are age 66 or older, your body mass index should be between 23-30. Your Body mass index is 26.33 kg/m. If this is out of the aforementioned range listed, please consider follow up with your Primary Care Provider.  __________________________________________________________  The [facility] providers would like to encourage you to use [facility] to communicate with providers for non-urgent requests or questions.  Due to long hold times on the telephone, sending your provider a message by [facility] may be a faster and more efficient way to get a response.  Please allow 48 business hours for a response.  Please remember that this is for non-urgent requests.    Thank you,  Dr. Jackquline Denmark

## 2020-12-21 DIAGNOSIS — I129 Hypertensive chronic kidney disease with stage 1 through stage 4 chronic kidney disease, or unspecified chronic kidney disease: Secondary | ICD-10-CM | POA: Diagnosis not present

## 2020-12-21 DIAGNOSIS — D631 Anemia in chronic kidney disease: Secondary | ICD-10-CM | POA: Diagnosis not present

## 2020-12-21 DIAGNOSIS — E785 Hyperlipidemia, unspecified: Secondary | ICD-10-CM | POA: Diagnosis not present

## 2020-12-21 DIAGNOSIS — K5669 Other partial intestinal obstruction: Secondary | ICD-10-CM | POA: Diagnosis not present

## 2020-12-21 DIAGNOSIS — N183 Chronic kidney disease, stage 3 unspecified: Secondary | ICD-10-CM | POA: Diagnosis not present

## 2020-12-21 DIAGNOSIS — K219 Gastro-esophageal reflux disease without esophagitis: Secondary | ICD-10-CM | POA: Diagnosis not present

## 2020-12-21 DIAGNOSIS — J984 Other disorders of lung: Secondary | ICD-10-CM | POA: Diagnosis not present

## 2020-12-21 DIAGNOSIS — N1831 Chronic kidney disease, stage 3a: Secondary | ICD-10-CM | POA: Diagnosis not present

## 2020-12-21 DIAGNOSIS — D72825 Bandemia: Secondary | ICD-10-CM | POA: Diagnosis not present

## 2020-12-21 DIAGNOSIS — K760 Fatty (change of) liver, not elsewhere classified: Secondary | ICD-10-CM | POA: Diagnosis not present

## 2020-12-21 DIAGNOSIS — F419 Anxiety disorder, unspecified: Secondary | ICD-10-CM | POA: Diagnosis not present

## 2020-12-21 DIAGNOSIS — E782 Mixed hyperlipidemia: Secondary | ICD-10-CM | POA: Diagnosis not present

## 2020-12-21 DIAGNOSIS — K56609 Unspecified intestinal obstruction, unspecified as to partial versus complete obstruction: Secondary | ICD-10-CM | POA: Diagnosis not present

## 2020-12-21 DIAGNOSIS — N179 Acute kidney failure, unspecified: Secondary | ICD-10-CM | POA: Diagnosis not present

## 2020-12-21 DIAGNOSIS — K56600 Partial intestinal obstruction, unspecified as to cause: Secondary | ICD-10-CM | POA: Diagnosis not present

## 2020-12-21 DIAGNOSIS — F32A Depression, unspecified: Secondary | ICD-10-CM | POA: Diagnosis not present

## 2020-12-21 DIAGNOSIS — Z20822 Contact with and (suspected) exposure to covid-19: Secondary | ICD-10-CM | POA: Diagnosis not present

## 2020-12-21 DIAGNOSIS — R109 Unspecified abdominal pain: Secondary | ICD-10-CM | POA: Diagnosis not present

## 2020-12-21 DIAGNOSIS — E871 Hypo-osmolality and hyponatremia: Secondary | ICD-10-CM | POA: Diagnosis not present

## 2020-12-21 DIAGNOSIS — R1013 Epigastric pain: Secondary | ICD-10-CM | POA: Diagnosis not present

## 2020-12-21 DIAGNOSIS — R1010 Upper abdominal pain, unspecified: Secondary | ICD-10-CM | POA: Diagnosis not present

## 2020-12-21 DIAGNOSIS — I700 Atherosclerosis of aorta: Secondary | ICD-10-CM | POA: Diagnosis not present

## 2020-12-31 DIAGNOSIS — N179 Acute kidney failure, unspecified: Secondary | ICD-10-CM | POA: Diagnosis not present

## 2020-12-31 DIAGNOSIS — K56609 Unspecified intestinal obstruction, unspecified as to partial versus complete obstruction: Secondary | ICD-10-CM | POA: Diagnosis not present

## 2020-12-31 DIAGNOSIS — R7303 Prediabetes: Secondary | ICD-10-CM | POA: Diagnosis not present

## 2020-12-31 DIAGNOSIS — Z23 Encounter for immunization: Secondary | ICD-10-CM | POA: Diagnosis not present

## 2020-12-31 DIAGNOSIS — K219 Gastro-esophageal reflux disease without esophagitis: Secondary | ICD-10-CM | POA: Diagnosis not present

## 2020-12-31 DIAGNOSIS — E871 Hypo-osmolality and hyponatremia: Secondary | ICD-10-CM | POA: Diagnosis not present

## 2021-01-01 ENCOUNTER — Telehealth: Payer: Self-pay | Admitting: Gastroenterology

## 2021-01-01 NOTE — Telephone Encounter (Signed)
Hi Brooke, we received a referral today from pt's PCP for a hospital f/u. Pt was hospitalized 5 days ago at [facility] for SBO. When I checked the pt's chart, I realized that pt is scheduled for colon on 01/25/21 with Dr. Lyndel Safe so not sure of how he would like to proceed. I am faxing the records from PCP for his review. Let me know if you do not receive the records. Thank you.

## 2021-01-04 DIAGNOSIS — E875 Hyperkalemia: Secondary | ICD-10-CM | POA: Diagnosis not present

## 2021-01-05 DIAGNOSIS — Z139 Encounter for screening, unspecified: Secondary | ICD-10-CM | POA: Diagnosis not present

## 2021-01-05 DIAGNOSIS — Z1331 Encounter for screening for depression: Secondary | ICD-10-CM | POA: Diagnosis not present

## 2021-01-05 DIAGNOSIS — Z0000 Encounter for general adult medical examination without abnormal findings: Secondary | ICD-10-CM | POA: Diagnosis not present

## 2021-01-05 DIAGNOSIS — E785 Hyperlipidemia, unspecified: Secondary | ICD-10-CM | POA: Diagnosis not present

## 2021-01-05 DIAGNOSIS — Z9181 History of falling: Secondary | ICD-10-CM | POA: Diagnosis not present

## 2021-01-05 NOTE — Telephone Encounter (Signed)
Lets hold off on colonoscopy for now Have patient come in for OV (routine) RG

## 2021-01-05 NOTE — Telephone Encounter (Signed)
Pt was notified of Dr. Lyndel Safe recommendations:  Colonoscopy cancelled: Pt made aware OV with Dr. Lyndel Safe scheduled for 02/03/2021 @ 10:40 at the [facility]. Address Provided Pt verbalized understanding with all questions answered.

## 2021-01-10 IMAGING — DX DG CHEST 2V
2 series · 2 of 2 positions shown · non-contrast
Comparison: June 10, 2019.

CLINICAL DATA: Chronic cough.

EXAM:
CHEST - 2 VIEW

[chest pa]
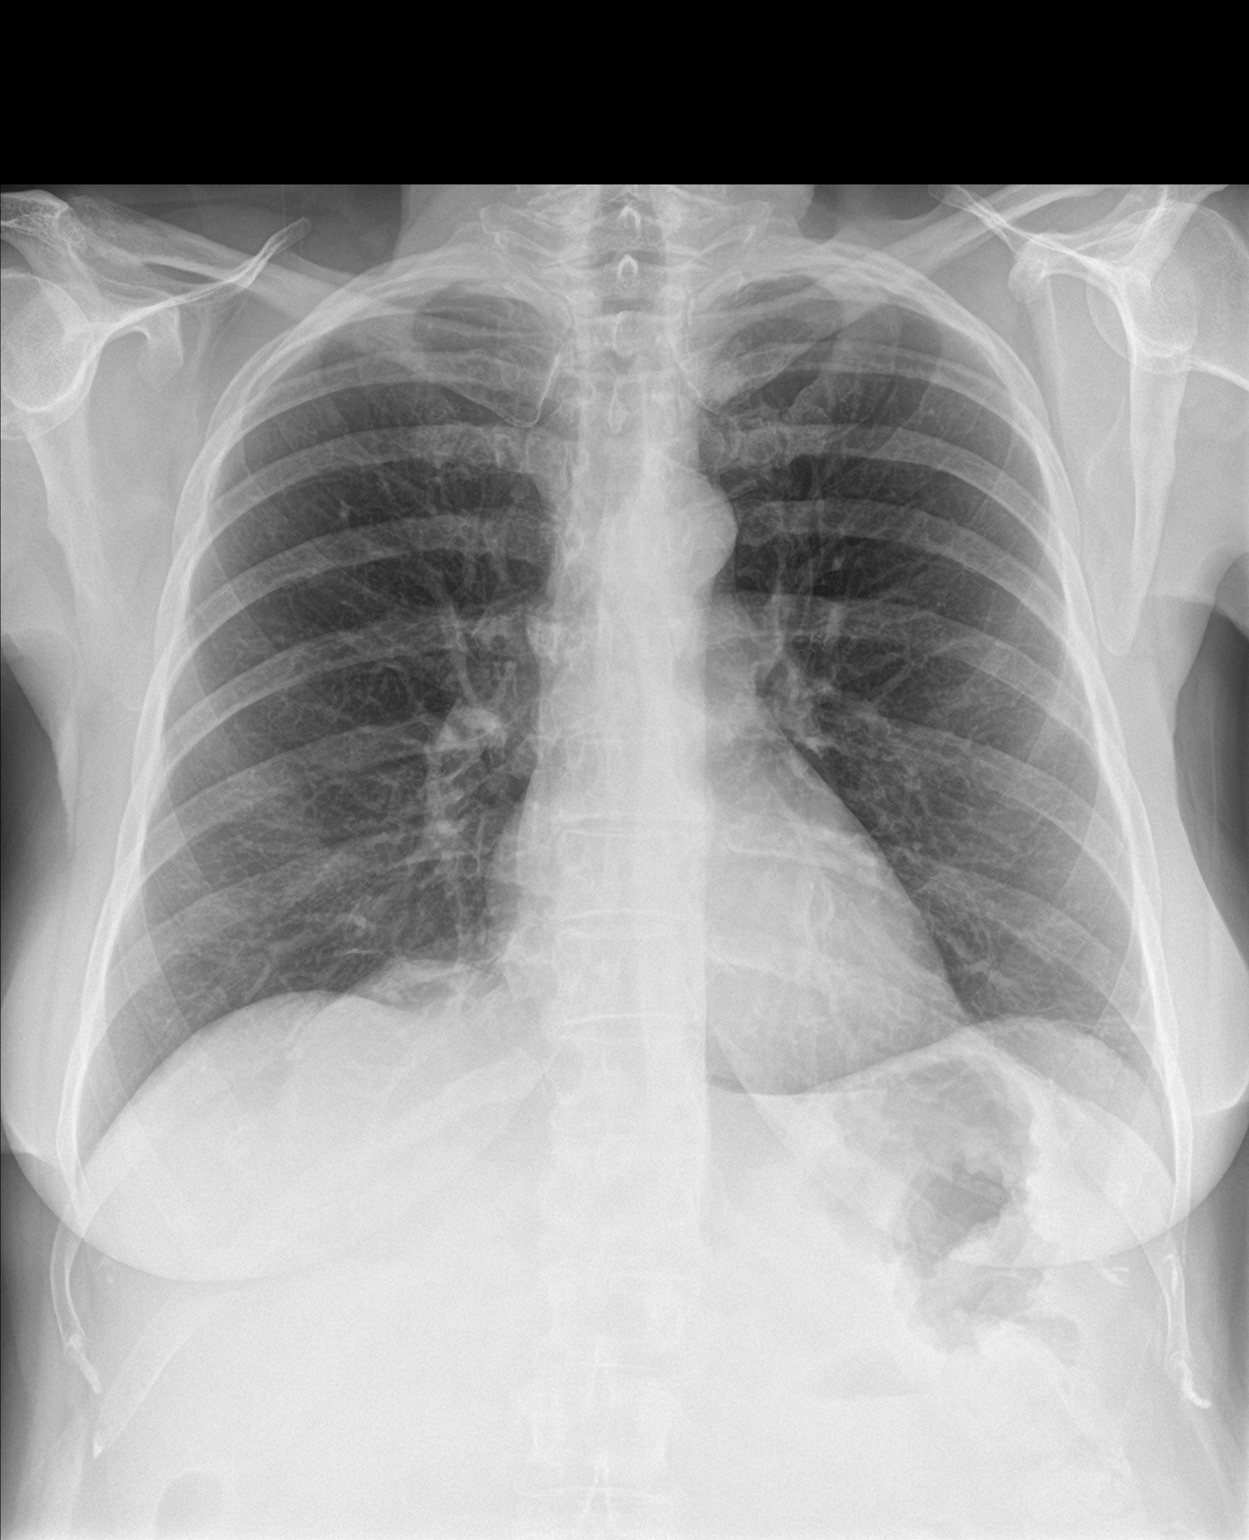

[chest lat]
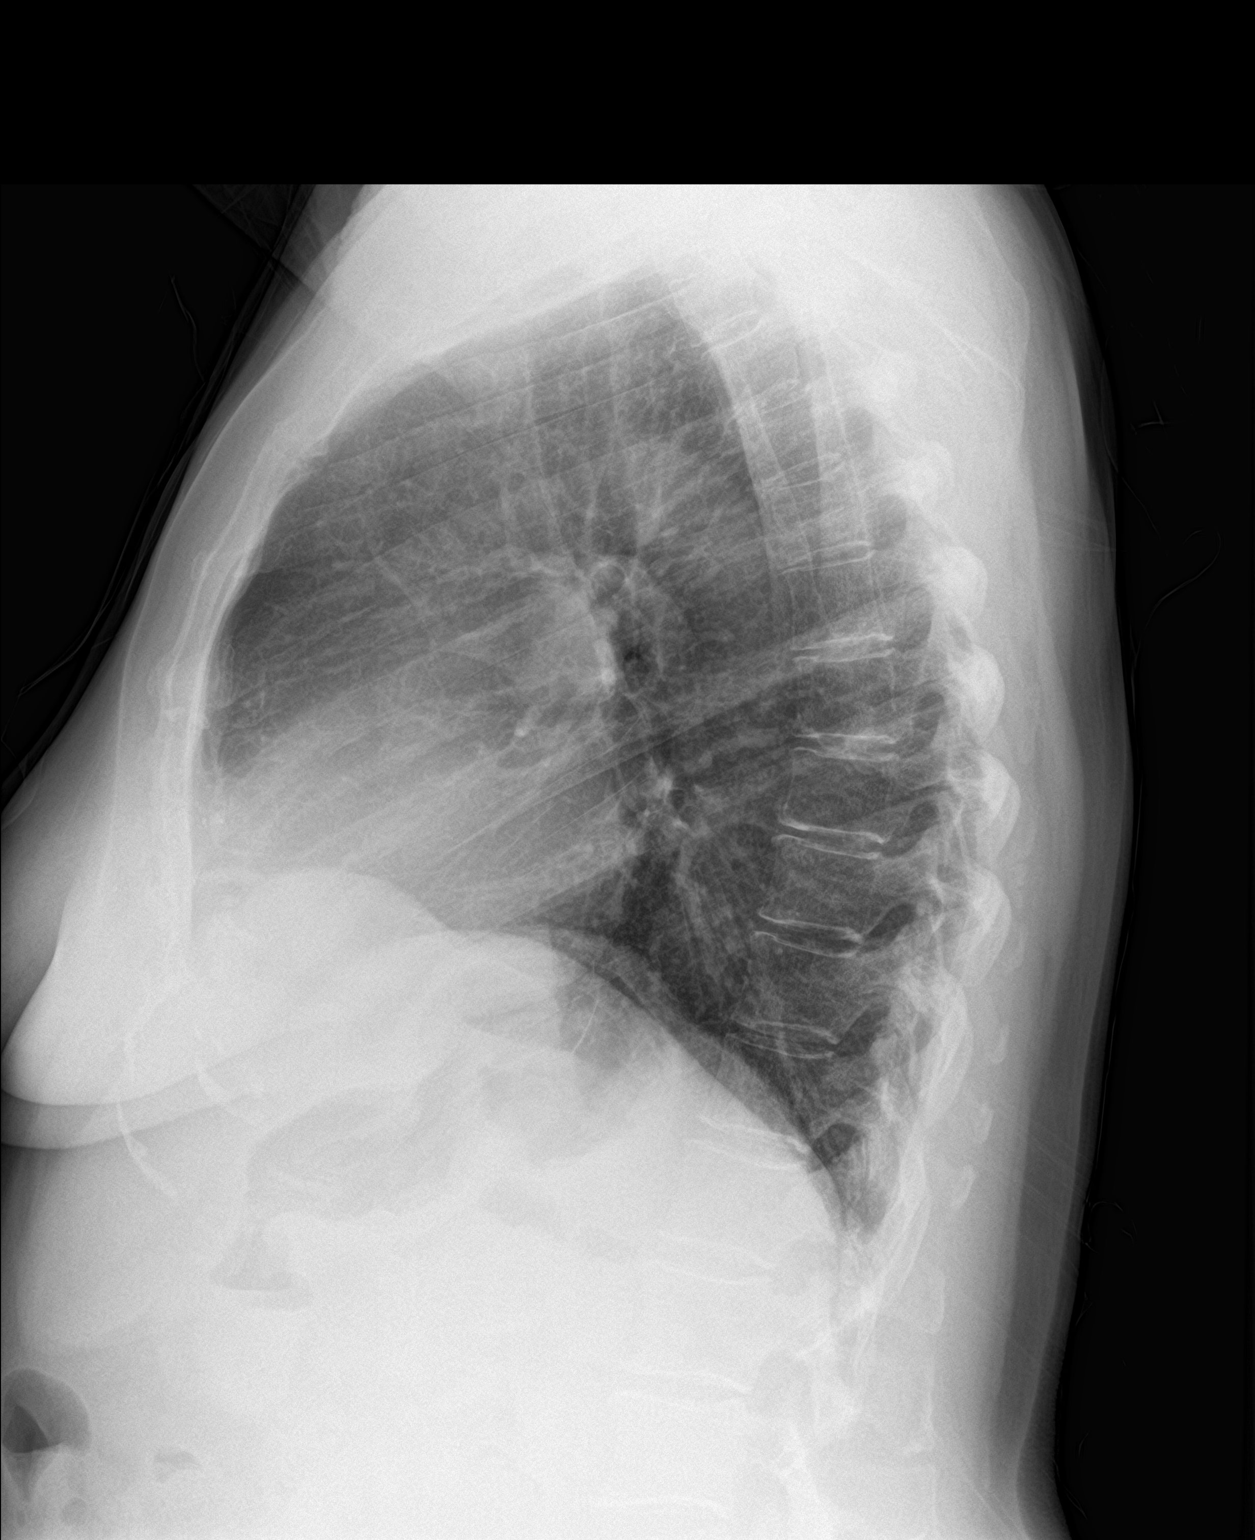

[2 of 2 positions shown; findings below may reference images not displayed]

FINDINGS: The heart size and mediastinal contours are within normal limits.
Both lungs are clear. The visualized skeletal structures are
unremarkable.
IMPRESSION: No active cardiopulmonary disease.

## 2021-01-11 DIAGNOSIS — E875 Hyperkalemia: Secondary | ICD-10-CM | POA: Diagnosis not present

## 2021-01-25 ENCOUNTER — Encounter: Payer: Medicare PPO | Admitting: Gastroenterology

## 2021-02-03 ENCOUNTER — Other Ambulatory Visit (INDEPENDENT_AMBULATORY_CARE_PROVIDER_SITE_OTHER): Payer: Medicare PPO

## 2021-02-03 ENCOUNTER — Ambulatory Visit (INDEPENDENT_AMBULATORY_CARE_PROVIDER_SITE_OTHER): Payer: Medicare PPO | Admitting: Gastroenterology

## 2021-02-03 ENCOUNTER — Other Ambulatory Visit: Payer: Self-pay

## 2021-02-03 ENCOUNTER — Encounter: Payer: Self-pay | Admitting: Gastroenterology

## 2021-02-03 VITALS — BP 102/68 | HR 84 | Ht 64.0 in | Wt 143.1 lb

## 2021-02-03 DIAGNOSIS — K56609 Unspecified intestinal obstruction, unspecified as to partial versus complete obstruction: Secondary | ICD-10-CM | POA: Diagnosis not present

## 2021-02-03 DIAGNOSIS — Z800 Family history of malignant neoplasm of digestive organs: Secondary | ICD-10-CM

## 2021-02-03 DIAGNOSIS — K219 Gastro-esophageal reflux disease without esophagitis: Secondary | ICD-10-CM

## 2021-02-03 DIAGNOSIS — K581 Irritable bowel syndrome with constipation: Secondary | ICD-10-CM | POA: Diagnosis not present

## 2021-02-03 LAB — CBC WITH DIFFERENTIAL/PLATELET
Basophils Absolute: 0.1 10*3/uL (ref 0.0–0.1)
Basophils Relative: 1.1 % (ref 0.0–3.0)
Eosinophils Absolute: 0.5 10*3/uL (ref 0.0–0.7)
Eosinophils Relative: 7.3 % — ABNORMAL HIGH (ref 0.0–5.0)
HCT: 36.8 % (ref 36.0–46.0)
Hemoglobin: 12 g/dL (ref 12.0–15.0)
Lymphocytes Relative: 31.2 % (ref 12.0–46.0)
Lymphs Abs: 2 10*3/uL (ref 0.7–4.0)
MCHC: 32.6 g/dL (ref 30.0–36.0)
MCV: 88.4 fl (ref 78.0–100.0)
Monocytes Absolute: 0.8 10*3/uL (ref 0.1–1.0)
Monocytes Relative: 11.5 % (ref 3.0–12.0)
Neutro Abs: 3.2 10*3/uL (ref 1.4–7.7)
Neutrophils Relative %: 48.9 % (ref 43.0–77.0)
Platelets: 566 10*3/uL — ABNORMAL HIGH (ref 150.0–400.0)
RBC: 4.16 Mil/uL (ref 3.87–5.11)
RDW: 14.8 % (ref 11.5–15.5)
WBC: 6.6 10*3/uL (ref 4.0–10.5)

## 2021-02-03 LAB — COMPREHENSIVE METABOLIC PANEL
ALT: 18 U/L (ref 0–35)
AST: 22 U/L (ref 0–37)
Albumin: 4.7 g/dL (ref 3.5–5.2)
Alkaline Phosphatase: 60 U/L (ref 39–117)
BUN: 24 mg/dL — ABNORMAL HIGH (ref 6–23)
CO2: 26 mEq/L (ref 19–32)
Calcium: 9.7 mg/dL (ref 8.4–10.5)
Chloride: 102 mEq/L (ref 96–112)
Creatinine, Ser: 1.19 mg/dL (ref 0.40–1.20)
GFR: 47.23 mL/min — ABNORMAL LOW (ref >60.00)
Glucose, Bld: 71 mg/dL (ref 70–99)
Potassium: 5.2 mEq/L — ABNORMAL HIGH (ref 3.5–5.1)
Sodium: 136 mEq/L (ref 135–145)
Total Bilirubin: 0.4 mg/dL (ref 0.2–1.2)
Total Protein: 7.7 g/dL (ref 6.0–8.3)

## 2021-02-03 NOTE — Patient Instructions (Addendum)
If you are age 67 or older, your body mass index should be between 23-30. Your Body mass index is 24.57 kg/m. If this is out of the aforementioned range listed, please consider follow up with your Primary Care Provider.  If you are age 64 or younger, your body mass index should be between 19-25. Your Body mass index is 24.57 kg/m. If this is out of the aformentioned range listed, please consider follow up with your Primary Care Provider.   ________________________________________________________  The [facility] providers would like to encourage you to use [facility] to communicate with providers for non-urgent requests or questions.  Due to long hold times on the telephone, sending your provider a message by [facility] may be a faster and more efficient way to get a response.  Please allow 48 business hours for a response.  Please remember that this is for non-urgent requests.  _______________________________________________________   Please purchase the following medications over the counter and take as directed: Miralax 17g daily  Continue Aciphex  Please go to the lab on the 2nd floor [address] before you leave the office today.   You have been scheduled for a CTE scan of the abdomen and pelvis at [facility] ([address], [place], [place] [address] 1st floor Radiology).   You are scheduled on 02-16-2021 at 11am. You have to arrive at 930am for registration. Please follow the written instructions below on the day of your exam:  WARNING: IF YOU ARE ALLERGIC TO IODINE/X-RAY DYE, PLEASE NOTIFY RADIOLOGY IMMEDIATELY AT [phone]! YOU WILL BE GIVEN A 13 HOUR PREMEDICATION PREP.  1) Do not eat or drink anything after 7am (4 hours prior to your test)  If you have any questions regarding your exam or if you need to reschedule, you may call the CT department at [phone] between the hours of 8:00 am and 5:00 pm,  Monday-Friday.  ________________________________________________________________________   Thank you,  Dr. Jackquline Denmark

## 2021-02-03 NOTE — Progress Notes (Signed)
Chief Complaint:   Referring Provider:  Nicoletta Dress, MD      ASSESSMENT AND PLAN;   #1. Recent PSBO, likely d/t adhesions. R/O other causes  #2. FH colon Ca ( mom at age 67, nephew at age 73)  #3. IBS-C  #4. GERD  Plan:  -CTE  -CBC, CMP -Dr Delena Bali blood work -EGD/Colon with 2 day prep therafter, if CTE neg (mid jan or feb 2023) -Continue Aciphex 20 mg p.o. once a day. -Miralax 17g po qd to continue.    I discussed EGD/Colonoscopy- the indications, risks, alternatives and potential complications including, but not limited to, bleeding, infection, reaction to medication, damage to internal organs, cardiac and/or pulmonary problems, and perforation requiring surgery (1 to 2 in 1000). The possibility that significant findings could be missed was explained. All ? were answered. The patient gives consent to proceed.   HPI:    Lisa Graham is a 65 y.o. female  With chronic osteoarthritis, fibromyalgia, anxiety/depression, CKD3, HLD, H/O PSBO in [place] over 20 years ago  FU PSBO adm 10/10- 10/13 at [facility] on CT showing transition point in right lower quadrant, NG attempted but she had traumatic epistasis, managed conservatively.  Serial x-ray KUB's showed resolution.  Patient is able to have Bms. Had H/O appendicectomy as a child at age 68, also H/O pelvic surgery due to benign ovarian cyst 2013  For GI FU.  Doing good.  Minimal right lower quadrant abdominal discomfort.  No further nausea, vomiting, abdominal distention.  She has been taking MiraLAX every day and has been having bowel movements 1/day.  Mostly constipated but has occasional diarrhea.     Has strong family history of colon cancer  C/O Longstanding constipation with pellet-like stools.  Better with miralax.  Has been having occasional globus sensation with intermittent heartburn, despite Aciphex.  Seen by Dr. Delena Bali and advised EGD at the time of colon.  No nausea, vomiting, regurgitation,  odynophagia or dysphagia.  No significant diarrhea.  No melena or hematochezia. No unintentional weight loss. No abdominal pain.  No sodas, chocolates, chewing gums, artificial sweeteners and candy. 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  Mild diverticulosis.  Otherwise grossly normal.  Prolonged exam.  Repeat in 5 years.  Earlier, for new problems. Had incomplete colonoscopy in [place] over 15 to 20 years ago-followed by barium enema. Past Medical History:  Diagnosis Date   Allergy    Anxiety    Arthritis    Bronchitis    2 months ago   Cancer ([place])    squamous cell skin cancer    Cyst of ovary    Depression    Depressive disorder    Elevated heart rate with elevated blood pressure without diagnosis of hypertension    Family history of colon cancer in mother    Fibromyalgia    GERD (gastroesophageal reflux disease)    H/O degenerative disc disease    Hypertriglyceridemia    Osteoarthritis    PNA (pneumonia)    2 mos ago   Skin cancer of arm, left    Forearm    Past Surgical History:  Procedure Laterality Date   ABDOMINAL HYSTERECTOMY     APPENDECTOMY     BACK SURGERY     COLONOSCOPY  06/13/2014   Minimal sigmoid diverticulosis. Highly redundant colon. Otherwise grossly normal colonoscopy to cecum   OVARIAN CYST REMOVAL  Family History  Problem Relation Age of Onset   Colon cancer Mother 81   Stroke Father    Colon cancer Nephew 54   Colon polyps Neg Hx    Esophageal cancer Neg Hx    Rectal cancer Neg Hx    Stomach cancer Neg Hx    Allergic rhinitis Neg Hx    Angioedema Neg Hx    Asthma Neg Hx    Atopy Neg Hx    Eczema Neg Hx    Immunodeficiency Neg Hx    Urticaria Neg Hx     Social History   Tobacco Use   Smoking status: Never   Smokeless tobacco: Never  Vaping Use   Vaping Use: Never used  Substance Use Topics    Alcohol use: Yes    Comment: occ   Drug use: No    Current Outpatient Medications  Medication Sig Dispense Refill   Ascorbic Acid (VITAMIN C) 100 MG tablet Take 100 mg by mouth daily.     buPROPion (WELLBUTRIN SR) 150 MG 12 hr tablet      cetirizine (ZYRTEC) 10 MG tablet TAKE 1 TABLET BY MOUTH EVERY DAY 90 tablet 1   Citalopram Hydrobromide (CELEXA PO) Take by mouth.     clonazePAM (KLONOPIN) 0.5 MG tablet Take 1 tablet by mouth 2 (two) times daily as needed.     cyclobenzaprine (FLEXERIL) 10 MG tablet Take 10 mg by mouth 3 (three) times daily as needed for muscle spasms.     diclofenac (VOLTAREN) 75 MG EC tablet Take 75 mg by mouth 2 (two) times daily.     fenofibrate 160 MG tablet Take 160 mg by mouth daily.      gabapentin (NEURONTIN) 400 MG capsule      hyoscyamine (ANASPAZ) 0.125 MG TBDP disintergrating tablet Place 0.125 mg under the tongue.     ipratropium (ATROVENT) 0.06 % nasal spray Place 2 sprays into both nostrils 3 (three) times daily as needed for rhinitis. 15 mL 5   montelukast (SINGULAIR) 10 MG tablet TAKE 1 TABLET BY MOUTH EVERYDAY AT BEDTIME 90 tablet 0   Omega-3 Fatty Acids (FISH OIL) 1000 MG CAPS Take 1,000 mg by mouth.     RABEprazole (ACIPHEX) 20 MG tablet      venlafaxine XR (EFFEXOR-XR) 150 MG 24 hr capsule      lactobacillus acidophilus (BACID) TABS tablet Take 1 tablet by mouth daily.     No current facility-administered medications for this visit.    No Known Allergies  Review of Systems:  Constitutional: Denies fever, chills, diaphoresis, appetite change and  has fatigue.  HEENT: Denies photophobia, eye pain, redness, hearing loss, ear pain, congestion, sore throat, rhinorrhea, sneezing, mouth sores, neck pain, neck stiffness and tinnitus.   Respiratory: Denies SOB, DOE, cough, chest tightness,  and wheezing.   Cardiovascular: Denies chest pain, palpitations and leg swelling.  Genitourinary: Denies dysuria, urgency, frequency, hematuria, flank pain and  difficulty urinating.  Musculoskeletal: Has  myalgias, back pain, joint swelling, arthralgias and gait problem.  Skin: No rash.  Neurological: Denies dizziness, seizures, syncope, weakness, light-headedness, numbness and headaches.  Hematological: Denies adenopathy. Easy bruising, personal or family bleeding history  Psychiatric/Behavioral: Has  anxiety or depression     Physical Exam:    BP 102/68   Pulse 84   Ht 5\' 4"  (1.626 m)   Wt 143 lb 2 oz (64.9 kg)   SpO2 97%   BMI 24.57 kg/m  Wt Readings from Last 3 Encounters:  02/03/21  143 lb 2 oz (64.9 kg)  11/11/20 153 lb 6 oz (69.6 kg)  11/10/20 154 lb 3.2 oz (69.9 kg)   Constitutional:  Well-developed, in no acute distress. Psychiatric: Normal mood and affect. Behavior is normal. HEENT: Pupils normal.  Conjunctivae are normal. No scleral icterus. Neck supple.  Cardiovascular: Normal rate, regular rhythm. No edema Pulmonary/chest: Effort normal and breath sounds normal. No wheezing, rales or rhonchi. Abdominal: Soft, nondistended. Nontender. Bowel sounds active throughout. There are no masses palpable. No hepatomegaly. Rectal: Deferred Neurological: Alert and oriented to person place and time. Skin: Skin is warm and dry. No rashes noted.  Data Reviewed: I have personally reviewed following labs and imaging studies  CBC: CBC Latest Ref Rng & Units 07/28/2020 02/05/2020 01/09/2020  WBC 3.4 - 10.8 x10E3/uL 7.7 8.5 7.5  Hemoglobin 11.1 - 15.9 g/dL 12.9 13.3 13.4  Hematocrit 34.0 - 46.6 % 39.1 41.6 41.7  Platelets 150 - 400 K/uL - 558(H) 604(H)    CMP: CMP Latest Ref Rng & Units 02/05/2020  Glucose 70 - 99 mg/dL 91  BUN 8 - 23 mg/dL 29(H)  Creatinine 0.44 - 1.00 mg/dL 1.19(H)  Sodium 135 - 145 mmol/L 138  Potassium 3.5 - 5.1 mmol/L 4.4  Chloride 98 - 111 mmol/L 103  CO2 22 - 32 mmol/L 26  Calcium 8.9 - 10.3 mg/dL 9.8  Total Protein 6.5 - 8.1 g/dL 8.0  Total Bilirubin 0.3 - 1.2 mg/dL 0.3  Alkaline Phos 38 - 126 U/L 49   AST 15 - 41 U/L 24  ALT 0 - 44 U/L 21    GFR: CrCl cannot be calculated (Patient's most recent lab result is older than the maximum 21 days allowed.). Liver Function Tests: No results for input(s): AST, ALT, ALKPHOS, BILITOT, PROT, ALBUMIN in the last 168 hours. No results for input(s): LIPASE, AMYLASE in the last 168 hours. No results for input(s): AMMONIA in the last 168 hours. Coagulation Profile: No results for input(s): INR, PROTIME in the last 168 hours. HbA1C: No results for input(s): HGBA1C in the last 72 hours. Lipid Profile: No results for input(s): CHOL, HDL, LDLCALC, TRIG, CHOLHDL, LDLDIRECT in the last 72 hours. Thyroid Function Tests: No results for input(s): TSH, T4TOTAL, FREET4, T3FREE, THYROIDAB in the last 72 hours. Anemia Panel: No results for input(s): VITAMINB12, FOLATE, FERRITIN, TIBC, IRON, RETICCTPCT in the last 72 hours.  No results found for this or any previous visit (from the past 240 hour(s)).    Radiology Studies: No results found.    Carmell Austria, MD 02/03/2021, 11:18 AM  Cc: Nicoletta Dress, MD

## 2021-02-16 ENCOUNTER — Other Ambulatory Visit: Payer: Self-pay

## 2021-02-16 ENCOUNTER — Ambulatory Visit (HOSPITAL_COMMUNITY)
Admission: RE | Admit: 2021-02-16 | Discharge: 2021-02-16 | Disposition: A | Discharge status: Home / Self Care | Payer: Medicare PPO | Source: Ambulatory Visit | Attending: Gastroenterology | Admitting: Gastroenterology

## 2021-02-16 DIAGNOSIS — K581 Irritable bowel syndrome with constipation: Secondary | ICD-10-CM | POA: Diagnosis not present

## 2021-02-16 DIAGNOSIS — K219 Gastro-esophageal reflux disease without esophagitis: Secondary | ICD-10-CM | POA: Diagnosis not present

## 2021-02-16 DIAGNOSIS — K56609 Unspecified intestinal obstruction, unspecified as to partial versus complete obstruction: Secondary | ICD-10-CM | POA: Insufficient documentation

## 2021-02-16 DIAGNOSIS — K760 Fatty (change of) liver, not elsewhere classified: Secondary | ICD-10-CM | POA: Diagnosis not present

## 2021-02-16 DIAGNOSIS — Z800 Family history of malignant neoplasm of digestive organs: Secondary | ICD-10-CM | POA: Diagnosis not present

## 2021-02-16 DIAGNOSIS — I700 Atherosclerosis of aorta: Secondary | ICD-10-CM | POA: Diagnosis not present

## 2021-02-16 MED ORDER — IOHEXOL 350 MG/ML SOLN
80.0000 mL | Freq: Once | INTRAVENOUS | Status: AC | PRN
  Administered 2021-02-16: 80 mL via INTRAVENOUS

## 2021-02-24 ENCOUNTER — Telehealth: Payer: Self-pay | Admitting: General Surgery

## 2021-02-24 NOTE — Telephone Encounter (Signed)
Patient wanted results of her CT and her labs, gave her results verbatim. Patient was also instructed to see her family physician regarding her potassium

## 2021-02-24 NOTE — Telephone Encounter (Signed)
-----   Message from Jackquline Denmark, MD sent at 02/21/2021  2:57 PM EST ----- Please inform the patient. No small bowel obstruction/strictures Has fatty liver with normal LFTs Rest looks good as well. Send report to family physician

## 2021-03-02 DIAGNOSIS — M1990 Unspecified osteoarthritis, unspecified site: Secondary | ICD-10-CM | POA: Diagnosis not present

## 2021-03-02 DIAGNOSIS — K581 Irritable bowel syndrome with constipation: Secondary | ICD-10-CM | POA: Diagnosis not present

## 2021-03-02 DIAGNOSIS — F419 Anxiety disorder, unspecified: Secondary | ICD-10-CM | POA: Diagnosis not present

## 2021-03-02 DIAGNOSIS — F331 Major depressive disorder, recurrent, moderate: Secondary | ICD-10-CM | POA: Diagnosis not present

## 2021-03-02 DIAGNOSIS — Z1231 Encounter for screening mammogram for malignant neoplasm of breast: Secondary | ICD-10-CM | POA: Diagnosis not present

## 2021-03-02 DIAGNOSIS — E785 Hyperlipidemia, unspecified: Secondary | ICD-10-CM | POA: Diagnosis not present

## 2021-03-02 DIAGNOSIS — M5090 Cervical disc disorder, unspecified, unspecified cervical region: Secondary | ICD-10-CM | POA: Diagnosis not present

## 2021-03-02 DIAGNOSIS — M797 Fibromyalgia: Secondary | ICD-10-CM | POA: Diagnosis not present

## 2021-03-12 DIAGNOSIS — R059 Cough, unspecified: Secondary | ICD-10-CM | POA: Diagnosis not present

## 2021-03-12 DIAGNOSIS — J029 Acute pharyngitis, unspecified: Secondary | ICD-10-CM | POA: Diagnosis not present

## 2021-03-12 DIAGNOSIS — J019 Acute sinusitis, unspecified: Secondary | ICD-10-CM | POA: Diagnosis not present

## 2021-03-16 ENCOUNTER — Ambulatory Visit: Payer: Medicare PPO | Admitting: Allergy & Immunology

## 2021-03-16 ENCOUNTER — Encounter: Payer: Self-pay | Admitting: Allergy & Immunology

## 2021-03-16 ENCOUNTER — Other Ambulatory Visit: Payer: Self-pay

## 2021-03-16 VITALS — BP 110/60 | HR 90 | Temp 97.1°F | Resp 17

## 2021-03-16 DIAGNOSIS — K219 Gastro-esophageal reflux disease without esophagitis: Secondary | ICD-10-CM | POA: Diagnosis not present

## 2021-03-16 DIAGNOSIS — J3089 Other allergic rhinitis: Secondary | ICD-10-CM | POA: Diagnosis not present

## 2021-03-16 DIAGNOSIS — J302 Other seasonal allergic rhinitis: Secondary | ICD-10-CM

## 2021-03-16 DIAGNOSIS — R053 Chronic cough: Secondary | ICD-10-CM | POA: Diagnosis not present

## 2021-03-16 MED ORDER — IPRATROPIUM BROMIDE 0.06 % NA SOLN: 2.0000 | Freq: Three times a day (TID) | NASAL | 11 refills | Status: AC | PRN

## 2021-03-16 MED ORDER — CETIRIZINE HCL 10 MG PO TABS: 10.0000 mg | ORAL_TABLET | Freq: Every day | ORAL | 2 refills | Status: DC

## 2021-03-16 NOTE — Patient Instructions (Addendum)
1. Chronic rhinitis (ragweed, weeds and indoor molds) - Continue taking: Flonase one spray per nostril twice daily and Zyrtec (cetirizine) 10mg  tablet once daily and nasal ipratrpopium one spray per nostril three times daily (this can be OVER drying, so be careful). - Consider allergy shots as a means of long-term control, but I think we can hold off for now.  - Allergy shots "re-train" and "reset" the immune system to ignore environmental allergens and decrease the resulting immune response to those allergens (sneezing, itchy watery eyes, runny nose, nasal congestion, etc).    - Allergy shots improve symptoms in 75-85% of patients. .  2. Cough  - We can hold off on the ENT referral since your cough is better.   - Continue with Protonix 40mg  daily AND Pepcid 40mg  TWICE DAILY.   3. Return in about 1 year (around 03/16/2022).    Please inform us of any Emergency Department visits, hospitalizations, or changes in symptoms. Call us before going to the ED for breathing or allergy symptoms since we might be able to fit you in for a sick visit. Feel free to contact us anytime with any questions, problems, or concerns.  It was a pleasure to see you again today!  Websites that have reliable patient information: 1. American Academy of Asthma, Allergy, and Immunology: www.aaaai.org 2. Food Allergy Research and Education (FARE): foodallergy.org 3. Mothers of Asthmatics: http://www.asthmacommunitynetwork.org 4. American College of Allergy, Asthma, and Immunology: www.acaai.org   COVID-19 Vaccine Information can be found at: ShippingScam.co.uk For questions related to vaccine distribution or appointments, please email [email or web address] or call [phone].   We realize that you might be concerned about having an allergic reaction to the COVID19 vaccines. To help with that concern, WE ARE OFFERING THE COVID19 VACCINES IN OUR OFFICE! Ask the  front desk for dates!     Like [place] on [place] and Instagram for our latest updates!      A healthy democracy works best when [employer] participate! Make sure you are registered to vote! If you have moved or changed any of your contact information, you will need to get this updated before voting!  In some cases, you MAY be able to register to vote online: [email or web address]

## 2021-03-16 NOTE — Progress Notes (Signed)
FOLLOW UP  Date of Service/Encounter:  03/16/21   Assessment:   Seasonal and perennial allergic rhinitis (ragweed, weeds and indoor molds)   Cough - controlled with aggressive reflux medication    Elevated eosinophils - ranging from 500-900 (cleared by Hematology/Oncology)   Thrombocytosis - improving (cleared by Hematology/Oncology)    Plan/Recommendations:   1. Chronic rhinitis (ragweed, weeds and indoor molds) - Continue taking: Flonase one spray per nostril twice daily and Zyrtec (cetirizine) 10mg  tablet once daily and nasal ipratrpopium one spray per nostril three times daily (this can be OVER drying, so be careful). - Consider allergy shots as a means of long-term control, but I think we can hold off for now.  - Allergy shots "re-train" and "reset" the immune system to ignore environmental allergens and decrease the resulting immune response to those allergens (sneezing, itchy watery eyes, runny nose, nasal congestion, etc).    - Allergy shots improve symptoms in 75-85% of patients. .  2. Cough  - We can hold off on the ENT referral since your cough is better.   - Continue with Protonix 40mg  daily AND Pepcid 40mg  TWICE DAILY.   3. Return in about 1 year (around 03/16/2022).   Subjective:   Lisa Graham is a 66 y.o. female presenting today for follow up of  Chief Complaint  Patient presents with   Allergic Rhinitis     Lisa Graham has a history of the following: Patient Active Problem List   Diagnosis Date Noted   Seasonal and perennial allergic rhinitis 11/28/2019   Gastroesophageal reflux disease 09/27/2019   Cough 09/27/2019   Seasonal allergic rhinitis due to pollen 09/27/2019   Anxiety 09/26/2019   Cervical disc disease 09/26/2019   Depression 09/26/2019   Diverticulitis 09/26/2019   GERD (gastroesophageal reflux disease) 09/26/2019   Hyperlipidemia 09/26/2019   Intestinal obstruction ([place]) 09/26/2019    History obtained from: chart  review and patient.  Lisa Graham is a 66 y.o. female presenting for a follow up visit.  She was last seen in August 2022.  At that time, she was continuing to have a cough despite our best efforts to control it.  She was already on Breztri without any improvement.  We recommended that she stop it.  We referred her to ENT for an evaluation of the cough.  We stopped her reflux medications and started her instead on Protonix 40 mg daily and Pepcid 40 mg twice daily.  Her rhinitis seems to be under good control with Flonase as well as Zyrtec.  Since the last visit, she has largely done well. She did have an infection over Christmas. She was treated with antibiotics and steroids. She has a couple of more days left. She has a few more days left. ALl of this has helped. She lost of her voice for one week but it has returned now. Her dog is now mad at her as well because she has insomnia and is unable to sleep.   Asthma/Respiratory Symptom History: Cough has mostly improved. She is fine without an ENT referral since the cough is largely normal. She is coughing now but this is related to her current URI.  She never received a call about the ENT referral and never really reached out to follow up on this. Regardless, she is doing much better.   Allergic Rhinitis Symptom History: She is doing well with the nasal spray and the antihistamine. She has not had any sinus infections or ear infections since the last  visit.   GERD Symptom History: She is on her pantoprazole and famotidine. She feels that her heart burn is well controlled.   Otherwise, there have been no changes to her past medical history, surgical history, family history, or social history.    Review of Systems  Constitutional: Negative.  Negative for chills, fever, malaise/fatigue and weight loss.  HENT:  Positive for congestion. Negative for ear discharge, ear pain and sinus pain.   Eyes:  Negative for pain, discharge and redness.  Respiratory:   Negative for cough, sputum production, shortness of breath and wheezing.   Cardiovascular: Negative.  Negative for chest pain and palpitations.  Gastrointestinal:  Negative for abdominal pain, constipation, diarrhea, heartburn, nausea and vomiting.  Skin: Negative.  Negative for itching and rash.  Neurological:  Negative for dizziness and headaches.  Endo/Heme/Allergies:  Positive for environmental allergies. Does not bruise/bleed easily.      Objective:   Blood pressure 110/60, pulse 90, temperature (!) 97.1 F (36.2 C), temperature source Temporal, resp. rate 17, SpO2 94 %. There is no height or weight on file to calculate BMI.   Physical Exam:  Physical Exam Vitals reviewed.  Constitutional:      Appearance: She is well-developed.     Comments: Talkative.  She did have some throat clearing during the visit. No coughing at all during the visit.  HENT:     Head: Normocephalic and atraumatic.     Right Ear: Tympanic membrane, ear canal and external ear normal.     Left Ear: Tympanic membrane, ear canal and external ear normal.     Nose: No nasal deformity, septal deviation, mucosal edema or rhinorrhea.     Right Turbinates: Enlarged, swollen and pale.     Left Turbinates: Enlarged, swollen and pale.     Right Sinus: No maxillary sinus tenderness or frontal sinus tenderness.     Left Sinus: No maxillary sinus tenderness or frontal sinus tenderness.     Comments: No nasal polyps noted.     Mouth/Throat:     Mouth: Mucous membranes are not pale and not dry.     Pharynx: Uvula midline.  Eyes:     General: Lids are normal. No allergic shiner.       Right eye: No discharge.        Left eye: No discharge.     Conjunctiva/sclera: Conjunctivae normal.     Right eye: Right conjunctiva is not injected. No chemosis.    Left eye: Left conjunctiva is not injected. No chemosis.    Pupils: Pupils are equal, round, and reactive to light.  Cardiovascular:     Rate and Rhythm: Normal rate  and regular rhythm.     Heart sounds: Normal heart sounds.  Pulmonary:     Effort: Pulmonary effort is normal. No tachypnea, accessory muscle usage or respiratory distress.     Breath sounds: Normal breath sounds. No wheezing, rhonchi or rales.     Comments: Moving air well in all lung fields.  No increased work of breathing. Chest:     Chest wall: No tenderness.  Lymphadenopathy:     Cervical: No cervical adenopathy.  Skin:    General: Skin is warm.     Capillary Refill: Capillary refill takes less than 2 seconds.     Coloration: Skin is not pale.     Findings: No abrasion, erythema, petechiae or rash. Rash is not papular, urticarial or vesicular.     Comments: No eczematous or urticarial lesions noted.  Neurological:     Mental Status: She is alert.  Psychiatric:        Behavior: Behavior is cooperative.     Diagnostic studies: none      Salvatore Marvel, MD  [facility]

## 2021-03-17 ENCOUNTER — Encounter: Payer: Self-pay | Admitting: Allergy & Immunology

## 2021-03-17 MED ORDER — FLUTICASONE PROPIONATE 50 MCG/ACT NA SUSP: 1.0000 | Freq: Two times a day (BID) | NASAL | 5 refills | Status: DC

## 2021-03-17 MED ORDER — PANTOPRAZOLE SODIUM 40 MG PO TBEC: 40.0000 mg | DELAYED_RELEASE_TABLET | Freq: Two times a day (BID) | ORAL | 5 refills | Status: DC

## 2021-03-17 MED ORDER — FAMOTIDINE 40 MG PO TABS: 40.0000 mg | ORAL_TABLET | Freq: Two times a day (BID) | ORAL | 5 refills | Status: DC

## 2021-04-05 DIAGNOSIS — J019 Acute sinusitis, unspecified: Secondary | ICD-10-CM | POA: Diagnosis not present

## 2021-04-05 DIAGNOSIS — B9689 Other specified bacterial agents as the cause of diseases classified elsewhere: Secondary | ICD-10-CM | POA: Diagnosis not present

## 2021-04-05 DIAGNOSIS — H8113 Benign paroxysmal vertigo, bilateral: Secondary | ICD-10-CM | POA: Diagnosis not present

## 2021-04-05 DIAGNOSIS — J208 Acute bronchitis due to other specified organisms: Secondary | ICD-10-CM | POA: Diagnosis not present

## 2021-06-21 DIAGNOSIS — H8113 Benign paroxysmal vertigo, bilateral: Secondary | ICD-10-CM | POA: Diagnosis not present

## 2021-06-21 DIAGNOSIS — J9801 Acute bronchospasm: Secondary | ICD-10-CM | POA: Diagnosis not present

## 2021-06-21 DIAGNOSIS — J069 Acute upper respiratory infection, unspecified: Secondary | ICD-10-CM | POA: Diagnosis not present

## 2021-06-25 ENCOUNTER — Other Ambulatory Visit: Payer: Self-pay | Admitting: Allergy & Immunology

## 2021-07-07 DIAGNOSIS — H8113 Benign paroxysmal vertigo, bilateral: Secondary | ICD-10-CM | POA: Diagnosis not present

## 2021-07-12 ENCOUNTER — Other Ambulatory Visit: Payer: Self-pay | Admitting: Allergy & Immunology

## 2021-07-19 DIAGNOSIS — H04123 Dry eye syndrome of bilateral lacrimal glands: Secondary | ICD-10-CM | POA: Diagnosis not present

## 2021-07-19 DIAGNOSIS — H25043 Posterior subcapsular polar age-related cataract, bilateral: Secondary | ICD-10-CM | POA: Diagnosis not present

## 2021-07-22 DIAGNOSIS — H8113 Benign paroxysmal vertigo, bilateral: Secondary | ICD-10-CM | POA: Diagnosis not present

## 2021-07-22 DIAGNOSIS — R42 Dizziness and giddiness: Secondary | ICD-10-CM | POA: Diagnosis not present

## 2021-09-21 ENCOUNTER — Other Ambulatory Visit: Payer: Self-pay | Admitting: Allergy & Immunology

## 2021-11-10 DIAGNOSIS — F331 Major depressive disorder, recurrent, moderate: Secondary | ICD-10-CM | POA: Diagnosis not present

## 2021-11-10 DIAGNOSIS — K581 Irritable bowel syndrome with constipation: Secondary | ICD-10-CM | POA: Diagnosis not present

## 2021-11-10 DIAGNOSIS — M797 Fibromyalgia: Secondary | ICD-10-CM | POA: Diagnosis not present

## 2021-11-10 DIAGNOSIS — E785 Hyperlipidemia, unspecified: Secondary | ICD-10-CM | POA: Diagnosis not present

## 2021-11-10 DIAGNOSIS — K219 Gastro-esophageal reflux disease without esophagitis: Secondary | ICD-10-CM | POA: Diagnosis not present

## 2021-11-10 DIAGNOSIS — M5090 Cervical disc disorder, unspecified, unspecified cervical region: Secondary | ICD-10-CM | POA: Diagnosis not present

## 2021-11-10 DIAGNOSIS — Z1231 Encounter for screening mammogram for malignant neoplasm of breast: Secondary | ICD-10-CM | POA: Diagnosis not present

## 2021-11-10 DIAGNOSIS — F419 Anxiety disorder, unspecified: Secondary | ICD-10-CM | POA: Diagnosis not present

## 2021-11-10 DIAGNOSIS — M1990 Unspecified osteoarthritis, unspecified site: Secondary | ICD-10-CM | POA: Diagnosis not present

## 2021-12-14 ENCOUNTER — Other Ambulatory Visit: Payer: Self-pay | Admitting: Allergy & Immunology

## 2022-04-27 DIAGNOSIS — M25562 Pain in left knee: Secondary | ICD-10-CM | POA: Diagnosis not present

## 2022-05-13 DIAGNOSIS — Z23 Encounter for immunization: Secondary | ICD-10-CM | POA: Diagnosis not present

## 2022-05-13 DIAGNOSIS — Z9181 History of falling: Secondary | ICD-10-CM | POA: Diagnosis not present

## 2022-05-13 DIAGNOSIS — F331 Major depressive disorder, recurrent, moderate: Secondary | ICD-10-CM | POA: Diagnosis not present

## 2022-05-13 DIAGNOSIS — M5090 Cervical disc disorder, unspecified, unspecified cervical region: Secondary | ICD-10-CM | POA: Diagnosis not present

## 2022-05-13 DIAGNOSIS — R110 Nausea: Secondary | ICD-10-CM | POA: Diagnosis not present

## 2022-05-13 DIAGNOSIS — M797 Fibromyalgia: Secondary | ICD-10-CM | POA: Diagnosis not present

## 2022-05-13 DIAGNOSIS — Z139 Encounter for screening, unspecified: Secondary | ICD-10-CM | POA: Diagnosis not present

## 2022-05-13 DIAGNOSIS — E785 Hyperlipidemia, unspecified: Secondary | ICD-10-CM | POA: Diagnosis not present

## 2022-05-13 DIAGNOSIS — F419 Anxiety disorder, unspecified: Secondary | ICD-10-CM | POA: Diagnosis not present

## 2022-06-07 ENCOUNTER — Other Ambulatory Visit: Payer: Self-pay | Admitting: *Deleted

## 2022-06-07 MED ORDER — FLUTICASONE PROPIONATE 50 MCG/ACT NA SUSP: 1.0000 | Freq: Two times a day (BID) | NASAL | 0 refills | Status: AC

## 2022-06-07 MED ORDER — FAMOTIDINE 40 MG PO TABS: 40.0000 mg | ORAL_TABLET | Freq: Two times a day (BID) | ORAL | 0 refills | Status: AC

## 2023-01-15 DIAGNOSIS — S300XXA Contusion of lower back and pelvis, initial encounter: Secondary | ICD-10-CM | POA: Diagnosis not present

## 2023-01-15 DIAGNOSIS — M1612 Unilateral primary osteoarthritis, left hip: Secondary | ICD-10-CM | POA: Diagnosis not present

## 2023-01-15 DIAGNOSIS — K4090 Unilateral inguinal hernia, without obstruction or gangrene, not specified as recurrent: Secondary | ICD-10-CM | POA: Diagnosis not present

## 2023-01-15 DIAGNOSIS — M545 Low back pain, unspecified: Secondary | ICD-10-CM | POA: Diagnosis not present

## 2023-01-15 DIAGNOSIS — M25552 Pain in left hip: Secondary | ICD-10-CM | POA: Diagnosis not present

## 2023-01-16 DIAGNOSIS — S7002XA Contusion of left hip, initial encounter: Secondary | ICD-10-CM | POA: Diagnosis not present

## 2023-02-08 DIAGNOSIS — Z1211 Encounter for screening for malignant neoplasm of colon: Secondary | ICD-10-CM | POA: Diagnosis not present

## 2023-02-08 DIAGNOSIS — M47897 Other spondylosis, lumbosacral region: Secondary | ICD-10-CM | POA: Diagnosis not present

## 2023-02-08 DIAGNOSIS — Z1231 Encounter for screening mammogram for malignant neoplasm of breast: Secondary | ICD-10-CM | POA: Diagnosis not present

## 2023-02-08 DIAGNOSIS — M5442 Lumbago with sciatica, left side: Secondary | ICD-10-CM | POA: Diagnosis not present

## 2023-04-07 DIAGNOSIS — J0100 Acute maxillary sinusitis, unspecified: Secondary | ICD-10-CM | POA: Diagnosis not present

## 2023-04-07 DIAGNOSIS — J40 Bronchitis, not specified as acute or chronic: Secondary | ICD-10-CM | POA: Diagnosis not present

## 2023-05-11 DIAGNOSIS — R051 Acute cough: Secondary | ICD-10-CM | POA: Diagnosis not present
